# Patient Record
Sex: Male | Born: 1995 | Hispanic: Yes | Marital: Single | Smoking: Never smoker
Health system: Southern US, Community
[De-identification: ages and names within clinical notes are randomized; demographics above are authoritative.]

---

## 2020-01-12 DIAGNOSIS — I639 Cerebral infarction, unspecified: Secondary | ICD-10-CM

## 2020-01-12 HISTORY — DX: Cerebral infarction, unspecified: I63.9

## 2020-05-29 ENCOUNTER — Inpatient Hospital Stay (HOSPITAL_COMMUNITY): Payer: Self-pay

## 2020-05-29 ENCOUNTER — Emergency Department (HOSPITAL_COMMUNITY): Payer: Self-pay

## 2020-05-29 ENCOUNTER — Encounter (HOSPITAL_COMMUNITY): Payer: Self-pay

## 2020-05-29 ENCOUNTER — Inpatient Hospital Stay (HOSPITAL_COMMUNITY)
Admission: EM | Admit: 2020-05-29 | Discharge: 2020-05-31 | DRG: 917 | Discharge status: Against Medical Advice | Payer: Self-pay | Attending: Internal Medicine | Admitting: Internal Medicine

## 2020-05-29 DIAGNOSIS — T50901A Poisoning by unspecified drugs, medicaments and biological substances, accidental (unintentional), initial encounter: Secondary | ICD-10-CM | POA: Diagnosis present

## 2020-05-29 DIAGNOSIS — F13939 Sedative, hypnotic or anxiolytic use, unspecified with withdrawal, unspecified: Secondary | ICD-10-CM | POA: Diagnosis not present

## 2020-05-29 DIAGNOSIS — I10 Essential (primary) hypertension: Secondary | ICD-10-CM | POA: Diagnosis present

## 2020-05-29 DIAGNOSIS — R188 Other ascites: Secondary | ICD-10-CM | POA: Diagnosis present

## 2020-05-29 DIAGNOSIS — E7220 Disorder of urea cycle metabolism, unspecified: Secondary | ICD-10-CM | POA: Diagnosis present

## 2020-05-29 DIAGNOSIS — G9341 Metabolic encephalopathy: Secondary | ICD-10-CM

## 2020-05-29 DIAGNOSIS — R748 Abnormal levels of other serum enzymes: Secondary | ICD-10-CM

## 2020-05-29 DIAGNOSIS — M6282 Rhabdomyolysis: Secondary | ICD-10-CM | POA: Diagnosis present

## 2020-05-29 DIAGNOSIS — R945 Abnormal results of liver function studies: Secondary | ICD-10-CM

## 2020-05-29 DIAGNOSIS — R29706 NIHSS score 6: Secondary | ICD-10-CM | POA: Diagnosis present

## 2020-05-29 DIAGNOSIS — Z20822 Contact with and (suspected) exposure to covid-19: Secondary | ICD-10-CM | POA: Diagnosis present

## 2020-05-29 DIAGNOSIS — E876 Hypokalemia: Secondary | ICD-10-CM | POA: Diagnosis present

## 2020-05-29 DIAGNOSIS — K760 Fatty (change of) liver, not elsewhere classified: Secondary | ICD-10-CM | POA: Diagnosis present

## 2020-05-29 DIAGNOSIS — R0902 Hypoxemia: Secondary | ICD-10-CM | POA: Diagnosis present

## 2020-05-29 DIAGNOSIS — R778 Other specified abnormalities of plasma proteins: Secondary | ICD-10-CM

## 2020-05-29 DIAGNOSIS — R471 Dysarthria and anarthria: Secondary | ICD-10-CM | POA: Diagnosis present

## 2020-05-29 DIAGNOSIS — Z03823 Encounter for observation for suspected inserted (injected) foreign body ruled out: Secondary | ICD-10-CM

## 2020-05-29 DIAGNOSIS — R29810 Facial weakness: Secondary | ICD-10-CM | POA: Diagnosis present

## 2020-05-29 DIAGNOSIS — Z03822 Encounter for observation for suspected aspirated (inhaled) foreign body ruled out: Secondary | ICD-10-CM

## 2020-05-29 DIAGNOSIS — E785 Hyperlipidemia, unspecified: Secondary | ICD-10-CM | POA: Diagnosis present

## 2020-05-29 DIAGNOSIS — N179 Acute kidney failure, unspecified: Secondary | ICD-10-CM | POA: Diagnosis present

## 2020-05-29 DIAGNOSIS — I214 Non-ST elevation (NSTEMI) myocardial infarction: Secondary | ICD-10-CM

## 2020-05-29 DIAGNOSIS — I639 Cerebral infarction, unspecified: Secondary | ICD-10-CM

## 2020-05-29 DIAGNOSIS — J690 Pneumonitis due to inhalation of food and vomit: Secondary | ICD-10-CM | POA: Diagnosis present

## 2020-05-29 DIAGNOSIS — T50904A Poisoning by unspecified drugs, medicaments and biological substances, undetermined, initial encounter: Secondary | ICD-10-CM

## 2020-05-29 DIAGNOSIS — I248 Other forms of acute ischemic heart disease: Secondary | ICD-10-CM | POA: Diagnosis present

## 2020-05-29 DIAGNOSIS — R4182 Altered mental status, unspecified: Secondary | ICD-10-CM

## 2020-05-29 DIAGNOSIS — R7303 Prediabetes: Secondary | ICD-10-CM | POA: Diagnosis present

## 2020-05-29 DIAGNOSIS — R7989 Other specified abnormal findings of blood chemistry: Secondary | ICD-10-CM

## 2020-05-29 DIAGNOSIS — R278 Other lack of coordination: Secondary | ICD-10-CM | POA: Diagnosis present

## 2020-05-29 DIAGNOSIS — R930 Abnormal findings on diagnostic imaging of skull and head, not elsewhere classified: Secondary | ICD-10-CM

## 2020-05-29 DIAGNOSIS — T40411A Poisoning by fentanyl or fentanyl analogs, accidental (unintentional), initial encounter: Principal | ICD-10-CM | POA: Diagnosis present

## 2020-05-29 DIAGNOSIS — I6340 Cerebral infarction due to embolism of unspecified cerebral artery: Secondary | ICD-10-CM | POA: Diagnosis present

## 2020-05-29 LAB — CBC WITH DIFFERENTIAL/PLATELET
Abs Immature Granulocytes: 0.1 10*3/uL — ABNORMAL HIGH (ref 0.00–0.07)
Basophils Absolute: 0 10*3/uL (ref 0.0–0.1)
Basophils Relative: 0 %
Eosinophils Absolute: 0 10*3/uL (ref 0.0–0.5)
Eosinophils Relative: 0 %
HCT: 47.6 % (ref 39.0–52.0)
Hemoglobin: 15.5 g/dL (ref 13.0–17.0)
Immature Granulocytes: 1 %
Lymphocytes Relative: 4 %
Lymphs Abs: 0.6 10*3/uL — ABNORMAL LOW (ref 0.7–4.0)
MCH: 29.6 pg (ref 26.0–34.0)
MCHC: 32.6 g/dL (ref 30.0–36.0)
MCV: 90.8 fL (ref 80.0–100.0)
Monocytes Absolute: 1.5 10*3/uL — ABNORMAL HIGH (ref 0.1–1.0)
Monocytes Relative: 11 %
Neutro Abs: 11.4 10*3/uL — ABNORMAL HIGH (ref 1.7–7.7)
Neutrophils Relative %: 84 %
Platelets: 206 10*3/uL (ref 150–400)
RBC: 5.24 MIL/uL (ref 4.22–5.81)
RDW: 13 % (ref 11.5–15.5)
WBC: 13.7 10*3/uL — ABNORMAL HIGH (ref 4.0–10.5)
nRBC: 0 % (ref 0.0–0.2)

## 2020-05-29 LAB — TROPONIN I (HIGH SENSITIVITY)
Troponin I (High Sensitivity): 1323 ng/L (ref <18)
Troponin I (High Sensitivity): 1566 ng/L (ref <18)
Troponin I (High Sensitivity): 1487 ng/L (ref <18)
Troponin I (High Sensitivity): 1317 ng/L (ref <18)

## 2020-05-29 LAB — HEPATIC FUNCTION PANEL
ALT: 372 U/L — ABNORMAL HIGH (ref 0–44)
AST: 387 U/L — ABNORMAL HIGH (ref 15–41)
Albumin: 4.6 g/dL (ref 3.5–5.0)
Alkaline Phosphatase: 84 U/L (ref 38–126)
Bilirubin, Direct: 0.1 mg/dL (ref 0.0–0.2)
Indirect Bilirubin: 0.7 mg/dL (ref 0.3–0.9)
Total Bilirubin: 0.8 mg/dL (ref 0.3–1.2)
Total Protein: 8.3 g/dL — ABNORMAL HIGH (ref 6.5–8.1)

## 2020-05-29 LAB — URINALYSIS, ROUTINE W REFLEX MICROSCOPIC
Bacteria, UA: NONE SEEN
Bilirubin Urine: NEGATIVE
Glucose, UA: >=500 mg/dL — AB
Ketones, ur: NEGATIVE mg/dL
Leukocytes,Ua: NEGATIVE
Nitrite: NEGATIVE
Protein, ur: 30 mg/dL — AB
Specific Gravity, Urine: 1.016 (ref 1.005–1.030)
pH: 6 (ref 5.0–8.0)

## 2020-05-29 LAB — HEMOGLOBIN A1C
Hgb A1c MFr Bld: 6 % — ABNORMAL HIGH (ref 4.8–5.6)
Mean Plasma Glucose: 125.5 mg/dL

## 2020-05-29 LAB — RAPID URINE DRUG SCREEN, HOSP PERFORMED
Amphetamines: POSITIVE — AB
Barbiturates: NOT DETECTED
Benzodiazepines: POSITIVE — AB
Cocaine: NOT DETECTED
Opiates: NOT DETECTED
Tetrahydrocannabinol: POSITIVE — AB

## 2020-05-29 LAB — LIPID PANEL
Cholesterol: 132 mg/dL (ref 0–200)
HDL: 45 mg/dL (ref >40)
LDL Cholesterol: 76 mg/dL (ref 0–99)
Total CHOL/HDL Ratio: 2.9 RATIO
Triglycerides: 54 mg/dL (ref <150)
VLDL: 11 mg/dL (ref 0–40)

## 2020-05-29 LAB — CK
Total CK: 1410 U/L — ABNORMAL HIGH (ref 49–397)
Total CK: 2772 U/L — ABNORMAL HIGH (ref 49–397)

## 2020-05-29 LAB — ETHANOL: Alcohol, Ethyl (B): <10 mg/dL (ref <10)

## 2020-05-29 LAB — HIV ANTIBODY (ROUTINE TESTING W REFLEX): HIV Screen 4th Generation wRfx: NONREACTIVE

## 2020-05-29 LAB — BASIC METABOLIC PANEL
Anion gap: 11 (ref 5–15)
BUN: 25 mg/dL — ABNORMAL HIGH (ref 6–20)
CO2: 22 mmol/L (ref 22–32)
Calcium: 9 mg/dL (ref 8.9–10.3)
Chloride: 110 mmol/L (ref 98–111)
Creatinine, Ser: 1.66 mg/dL — ABNORMAL HIGH (ref 0.61–1.24)
GFR, Estimated: 59 mL/min — ABNORMAL LOW (ref >60)
Glucose, Bld: 118 mg/dL — ABNORMAL HIGH (ref 70–99)
Potassium: 3.9 mmol/L (ref 3.5–5.1)
Sodium: 143 mmol/L (ref 135–145)

## 2020-05-29 LAB — CBG MONITORING, ED: Glucose-Capillary: 113 mg/dL — ABNORMAL HIGH (ref 70–99)

## 2020-05-29 LAB — AMMONIA
Ammonia: 30 umol/L (ref 9–35)
Ammonia: 47 umol/L — ABNORMAL HIGH (ref 9–35)

## 2020-05-29 LAB — RESP PANEL BY RT-PCR (FLU A&B, COVID) ARPGX2
Influenza A by PCR: NEGATIVE
Influenza B by PCR: NEGATIVE
SARS Coronavirus 2 by RT PCR: NEGATIVE

## 2020-05-29 LAB — VITAMIN B12: Vitamin B-12: 644 pg/mL (ref 180–914)

## 2020-05-29 IMAGING — MR MR HEAD W/O CM
10 series · 48 of 48 positions shown · non-contrast
Comparison: Same day CT head.

EXAM:
MRI HEAD WITHOUT CONTRAST
TECHNIQUE: Multiplanar, multiecho pulse sequences of the brain and surrounding
structures were obtained without intravenous contrast.

[Series 5: DWI · axial · 3.0mm · 1.36mm/px · z∈[-7,+127]mm · 9 of 96 slices shown (1 of 2)]
[im 1/96]
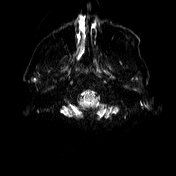
[im 12/96]
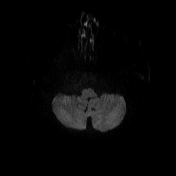
[im 24/96]
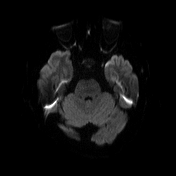
[im 36/96]
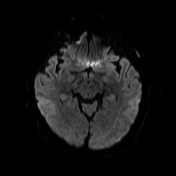
[im 48/96]
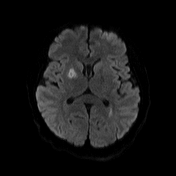
[im 60/96]
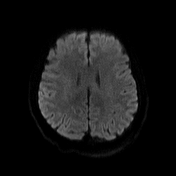
[im 72/96]
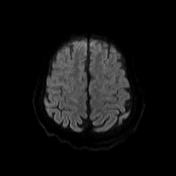
[im 84/96]
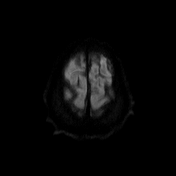
[im 96/96]
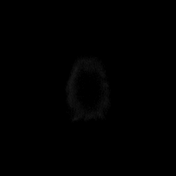

[Series 6: DWI · axial · 3.0mm · 1.36mm/px · z∈[-7,+127]mm · 4 of 48 slices shown (2 of 2)]
[im 1/48]
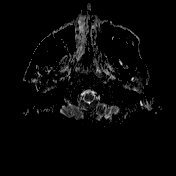
[im 16/48]
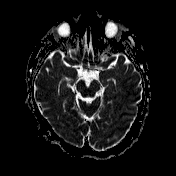
[im 32/48]
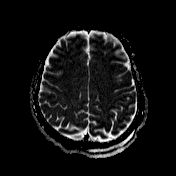
[im 48/48]
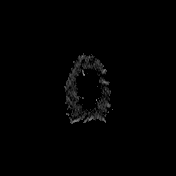

[Series 7: T1 · sagittal · 5.0mm · 0.75mm/px · 2 of 24 slices shown (1 of 2)]
[im 1/24]
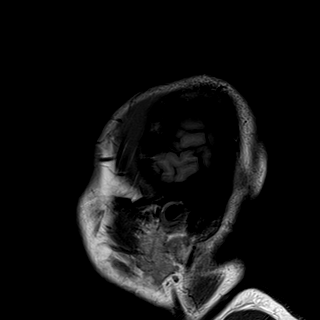
[im 24/24]
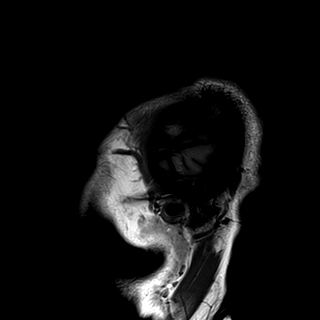

[Series 8: T2 · axial · 5.0mm · 0.62mm/px · z∈[-14,+141]mm · 2 of 26 slices shown (1 of 2)]
[im 1/26]
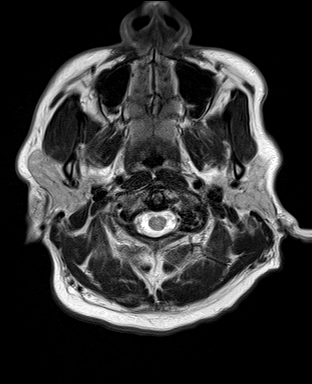
[im 26/26]
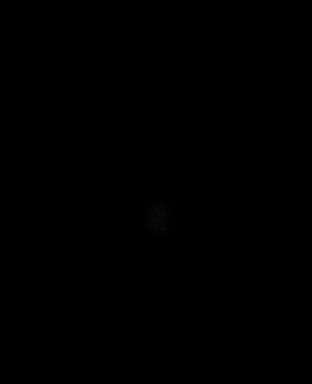

[Series 10: swi_images · axial · 3.0mm · 0.75mm/px · z∈[-39,+165]mm · 6 of 72 slices shown]
[im 1/72]
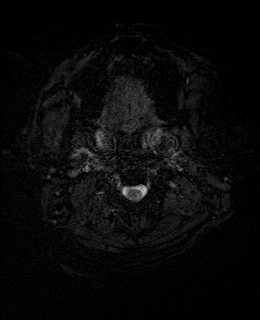
[im 15/72]
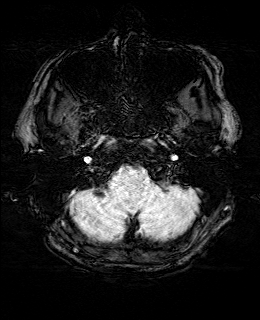
[im 29/72]
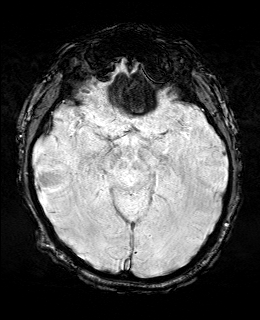
[im 43/72]
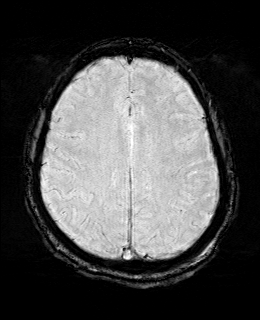
[im 57/72]
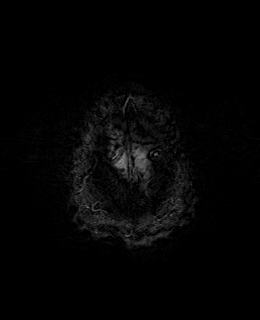
[im 72/72]
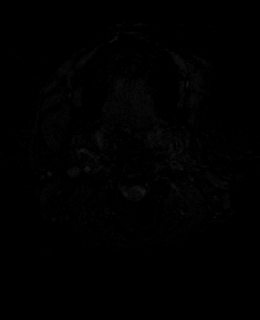

[Series 11: FLAIR · axial · 3.0mm · 0.75mm/px · z∈[-10,+136]mm · 4 of 52 slices shown]
[im 1/52]
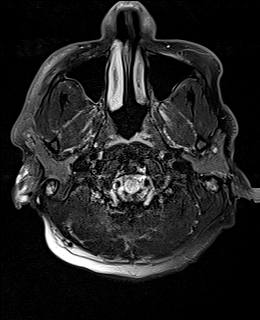
[im 18/52]
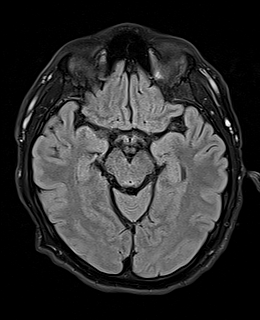
[im 35/52]
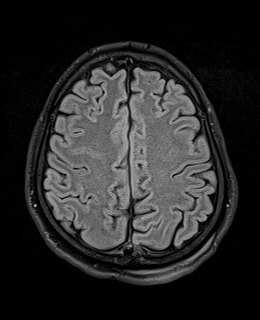
[im 52/52]
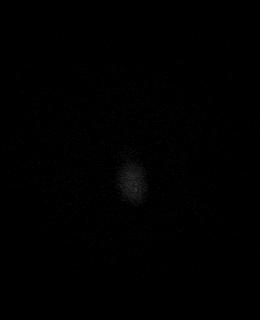

[Series 12: T1 · axial · 1.0mm · 0.94mm/px · z∈[-13,+139]mm · 13 of 160 slices shown (2 of 2)]
[im 1/160]
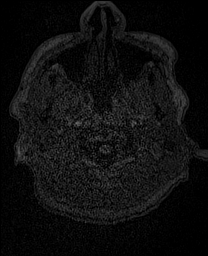
[im 14/160]
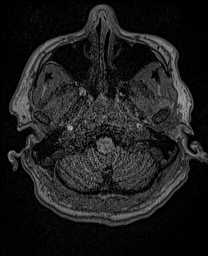
[im 27/160]
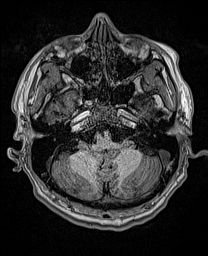
[im 40/160]
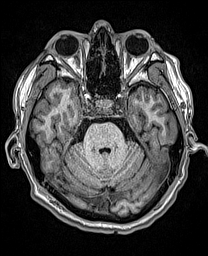
[im 54/160]
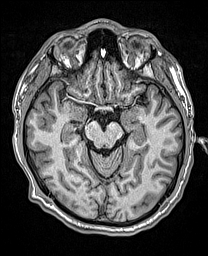
[im 67/160]
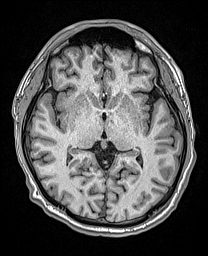
[im 80/160]
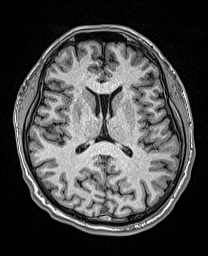
[im 93/160]
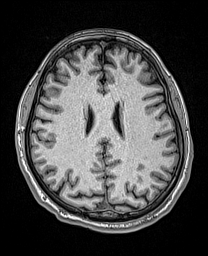
[im 107/160]
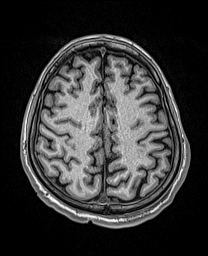
[im 120/160]
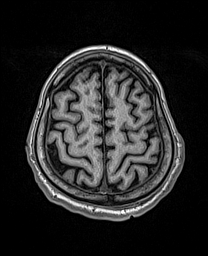
[im 133/160]
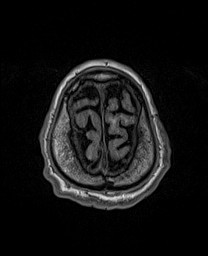
[im 146/160]
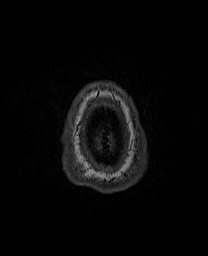
[im 160/160]
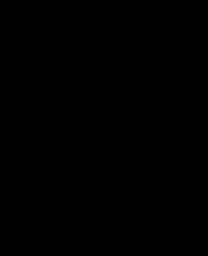

[Series 13: cor dwi_tracew · coronal · 5.0mm · 1.53mm/px · 4 of 50 slices shown]
[im 1/50]
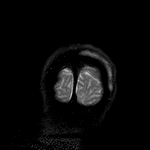
[im 17/50]
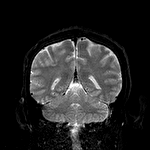
[im 33/50]
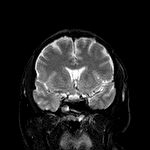
[im 50/50]
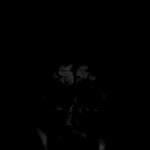

[Series 14: cor dwi_adc · coronal · 5.0mm · 1.53mm/px · 2 of 25 slices shown]
[im 1/25]
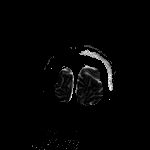
[im 25/25]
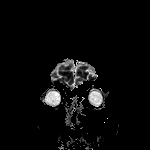

[Series 15: T2 · coronal · 5.0mm · 0.86mm/px · 2 of 28 slices shown (2 of 2)]
[im 1/28]
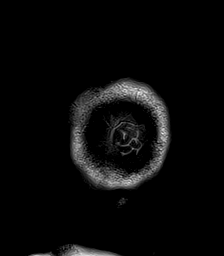
[im 28/28]
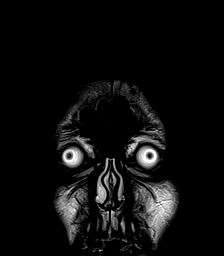

[48 of 48 positions shown; findings below may reference images not displayed]

FINDINGS: Brain: Acute 1.4 cm perforator infarct in the right putamen/globus
pallidus (series 5, image 71) with additional punctate acute infarct
in the adjacent anterior limb of the right internal capsule (series
5, image 75). Acute 0.6 cm perforator infarct in the left globus
pallidus. Amorphous acute infarct in the left posterior parietal
white matter (series 5, image 73). Associated edema without
significant mass effect. No acute hemorrhage. No hydrocephalus.
Cavum septum pellucidum et vergae, anatomic variant. No mass lesion.
No midline shift. Basal cisterns are patent. No extra-axial fluid
collection.

Vascular: Major arterial flow voids are maintained at the skull
base.

Skull and upper cervical spine: Diffuse T1 hypointensity of the
visualized cervical bone marrow. No focal marrow replacing lesion.

Sinuses/Orbits: Mild mucosal thickening of scattered ethmoid air
cells, bilateral sphenoid sinuses and maxillary sinuses with
bilateral sphenoid sinus air-fluid levels and frothy secretions.

Other: No mastoid effusions.
IMPRESSION: 1. Acute perforator infarcts in bilateral basal ganglia and acute
left parietal white matter infarct, as detailed above. Associated
edema without significant mass effect. Given involvement of multiple
vascular territories, consider embolic etiology.
2. Mild paranasal sinus mucosal thickening with bilateral sphenoid
sinus air-fluid levels.
3. Diffuse T1 hypointensity of the visualized cervical bone marrow,
which is nonspecific but could be related to chronic anemia, chronic
hypoxia (such as in smokers), or less likely a lymphoproliferative
disorder.

## 2020-05-29 IMAGING — CR DG ABDOMEN 1V
1 series · 1 of 1 positions shown · non-contrast
Comparison: None.

CLINICAL DATA: Pre MRI. Patient unresponsive. Evaluate for foreign
body that may preclude MRI.

EXAM:
ABDOMEN - 1 VIEW

[x abdomen supine]
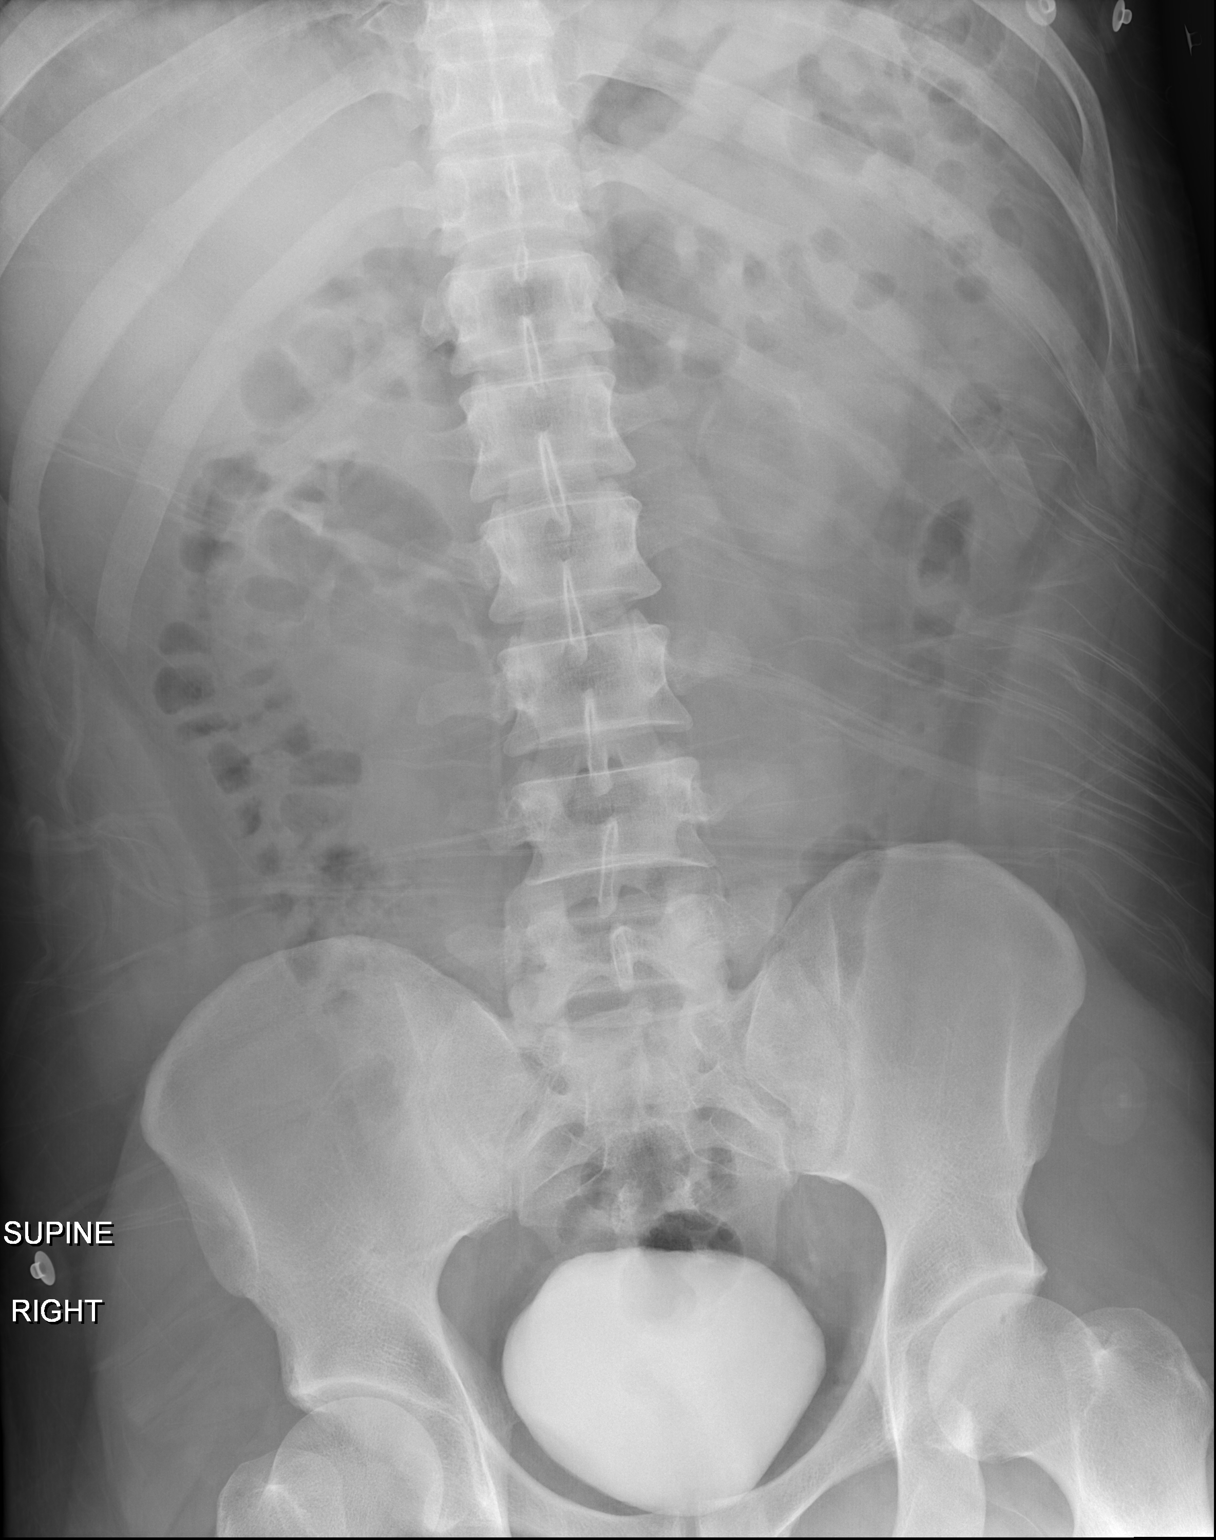

[1 of 1 positions shown; findings below may reference images not displayed]

FINDINGS: Contrast noted in the renal collecting systems and bladder from a
recent CT scan.

The abdominal bowel gas pattern is unremarkable.

No radiopaque foreign bodies are identified.
IMPRESSION: No radiopaque foreign bodies are identified.

## 2020-05-29 IMAGING — CT CT ANGIO CHEST
2 of 6 series · 18 of 36 positions shown · IV contrast (omnipaque)
Comparison: None.

CLINICAL DATA: Unresponsive, hypoxic, high probability PE

EXAM:
CT ANGIOGRAPHY CHEST WITH CONTRAST
TECHNIQUE: Multidetector CT imaging of the chest was performed using the
standard protocol during bolus administration of intravenous
contrast. Multiplanar CT image reconstructions and MIPs were
obtained to evaluate the vascular anatomy.
CONTRAST:  100mL OMNIPAQUE IOHEXOL 350 MG/ML SOLN

[Series 5: thins · axial · 0.69mm/px · z∈[-457,-251]mm · 17 of 232 slices shown]
[im 13/232  lung]
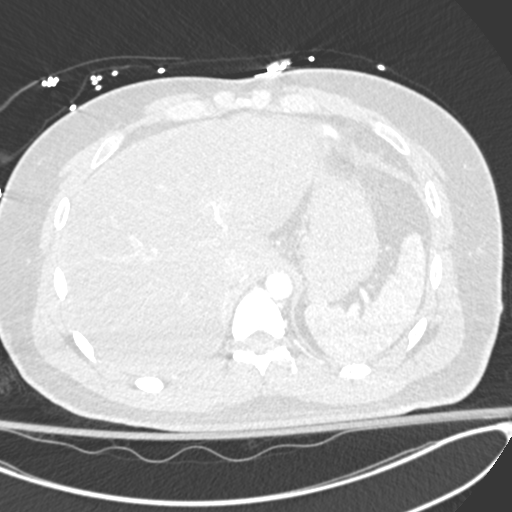
[im 26/232  mediastinal]
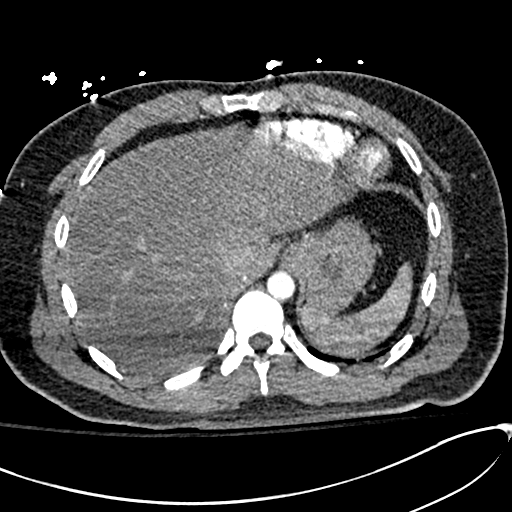
[im 39/232  lung]
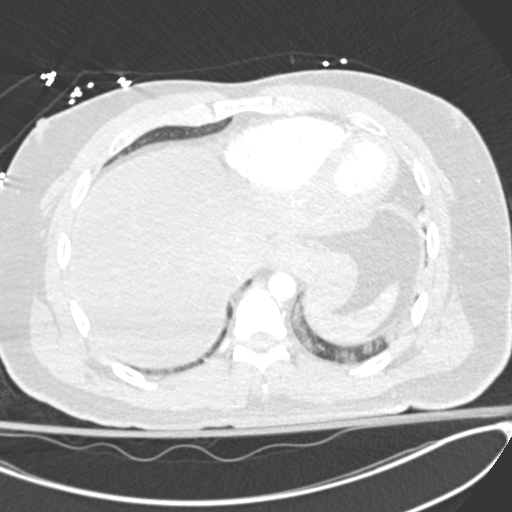
[im 52/232  mediastinal]
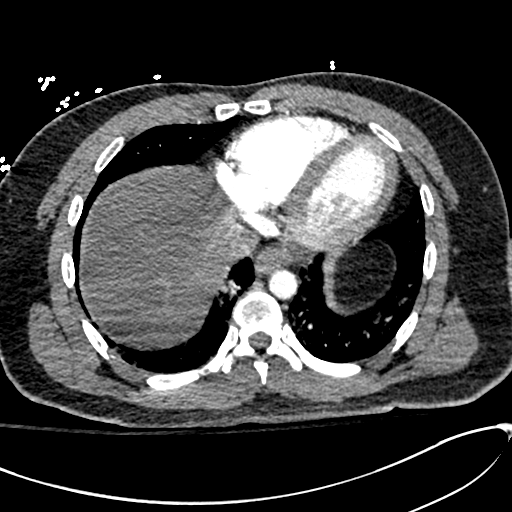
[im 65/232  lung]
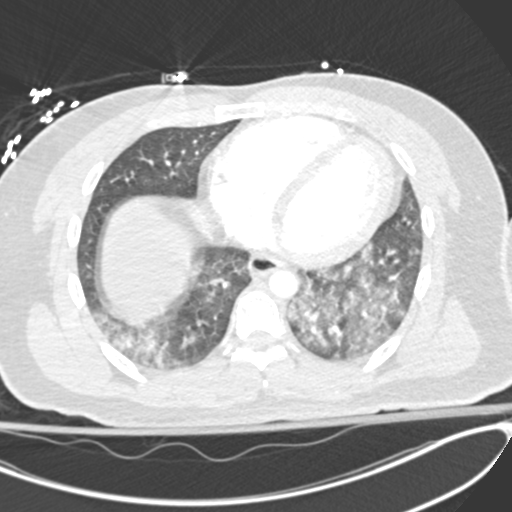
[im 78/232  mediastinal]
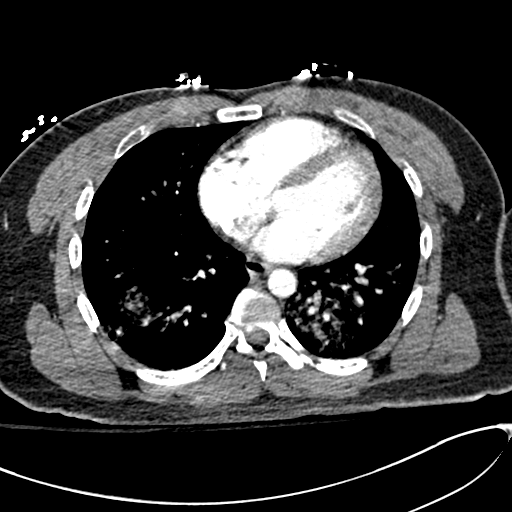
[im 90/232  lung]
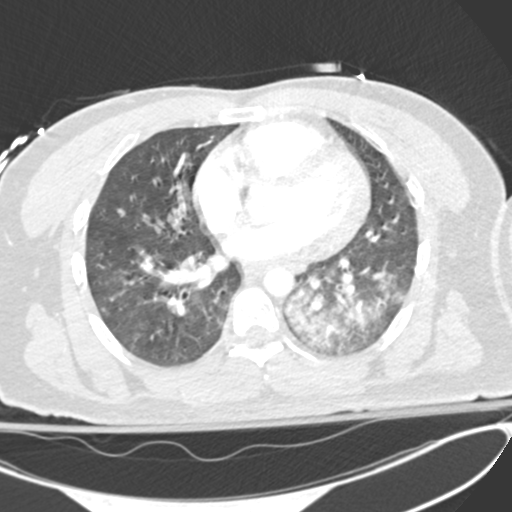
[im 103/232  mediastinal]
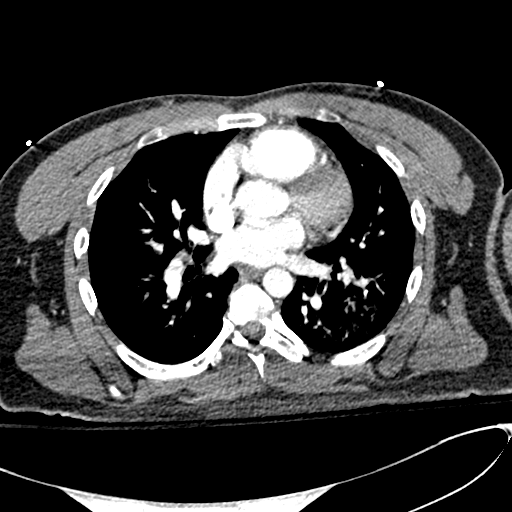
[im 116/232  lung]
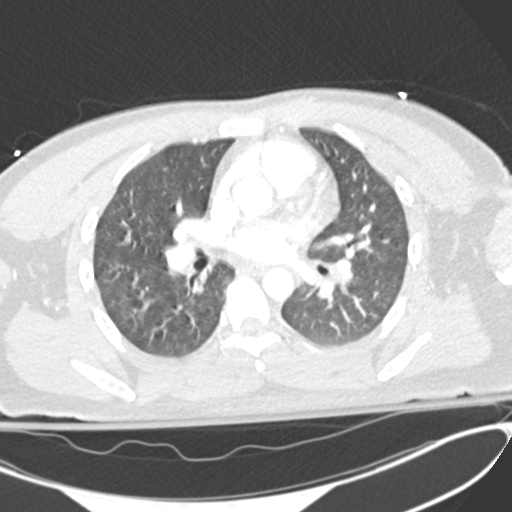
[im 129/232  mediastinal]
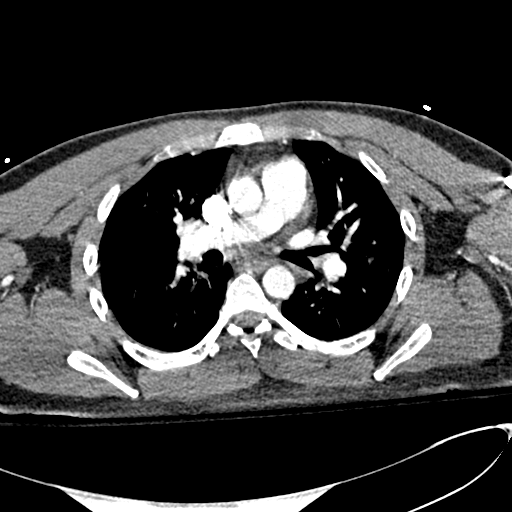
[im 142/232  lung]
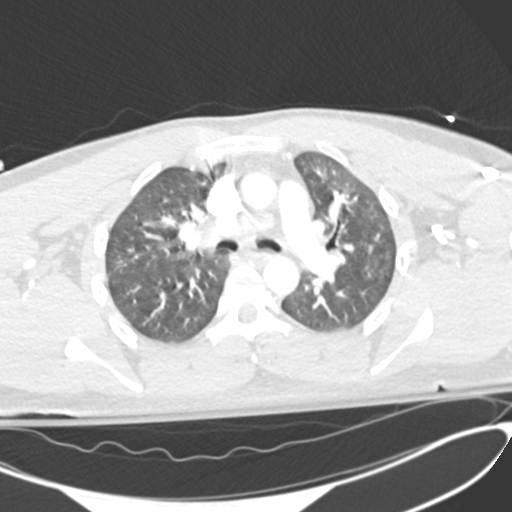
[im 155/232  mediastinal]
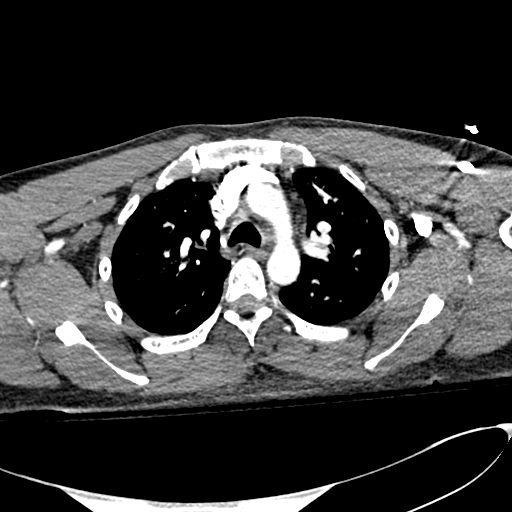
[im 167/232  lung]
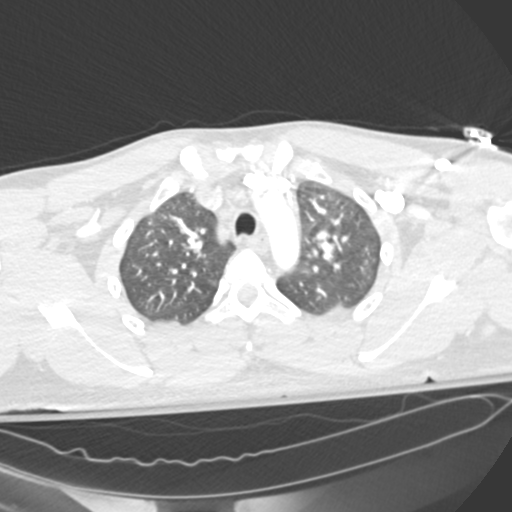
[im 180/232  mediastinal]
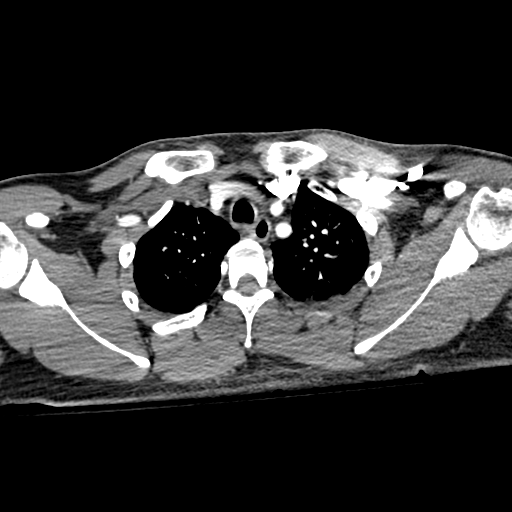
[im 193/232  lung]
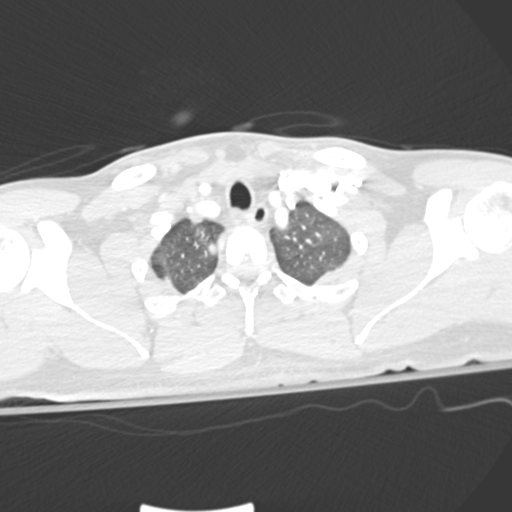
[im 206/232  mediastinal]
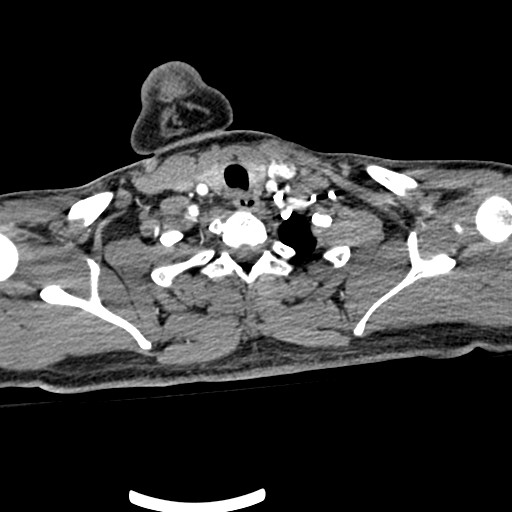
[im 219/232  lung]
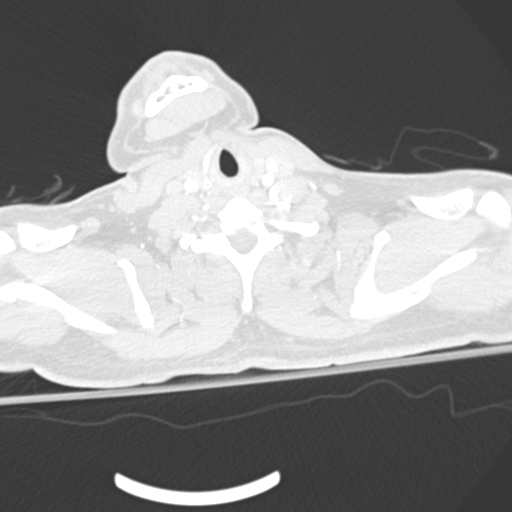

[Series 7: coronal mpr · coronal · 0.49mm/px · 1 of 118 slices shown]
[im 59/118  mediastinal]
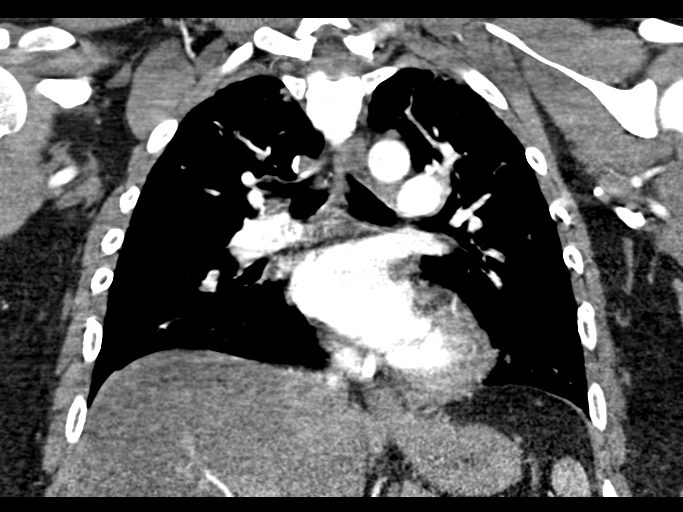

[18 of 36 positions shown; findings below may reference images not displayed]

FINDINGS: Cardiovascular: Heart size normal. No pericardial effusion.
Satisfactory opacification of pulmonary arteries noted, and there is
no evidence of pulmonary emboli. Motion degrades some of the images
through the lung bases. Adequate contrast opacification of the
thoracic aorta with no evidence of dissection, aneurysm, or
stenosis. There is bovine variant brachiocephalic arch anatomy
without proximal stenosis. No significant atheromatous change.

Mediastinum/Nodes: No mass or adenopathy.

Lungs/Pleura: No pleural effusion. No pneumothorax. Patchy
ground-glass and airspace opacities in both lower lobes left greater
than right. Scattered ground-glass alveolar opacities in the
posterior left upper lobe.

Upper Abdomen: No acute abnormality.

Musculoskeletal: No chest wall abnormality. No acute or significant
osseous findings.

Review of the MIP images confirms the above findings.
IMPRESSION: 1. Negative for acute PE or thoracic aortic dissection.
2. Patchy bibasilar and posterior left upper lobe infiltrates,
nonspecific but possibly related to aspiration.

## 2020-05-29 MED ORDER — SODIUM CHLORIDE 0.9 % IV SOLN
3.0000 g | Freq: Once | INTRAVENOUS | Status: AC
  Administered 2020-05-29: 3 g via INTRAVENOUS
  Filled 2020-05-29: qty 8

## 2020-05-29 MED ORDER — NALOXONE HCL 0.4 MG/ML IJ SOLN
0.2000 mg | Freq: Once | INTRAMUSCULAR | Status: DC
  Filled 2020-05-29: qty 1

## 2020-05-29 MED ORDER — IOHEXOL 350 MG/ML SOLN
100.0000 mL | Freq: Once | INTRAVENOUS | Status: AC | PRN
  Administered 2020-05-29: 100 mL via INTRAVENOUS

## 2020-05-29 MED ORDER — SODIUM CHLORIDE 0.9 % IV SOLN: INTRAVENOUS | Status: DC

## 2020-05-29 MED ORDER — SODIUM CHLORIDE 0.9 % IV SOLN
2.0000 g | INTRAVENOUS | Status: DC
  Administered 2020-05-29 – 2020-05-30 (×2): 2 g via INTRAVENOUS
  Filled 2020-05-29 (×2): qty 20

## 2020-05-29 MED ORDER — SODIUM CHLORIDE 0.9 % IV SOLN
1.0000 mg | Freq: Once | INTRAVENOUS | Status: AC
  Administered 2020-05-29: 1 mg via INTRAVENOUS
  Filled 2020-05-29: qty 0.2

## 2020-05-29 MED ORDER — LACTATED RINGERS IV SOLN: INTRAVENOUS | Status: DC

## 2020-05-29 MED ORDER — ENOXAPARIN SODIUM 40 MG/0.4ML IJ SOSY
40.0000 mg | PREFILLED_SYRINGE | INTRAMUSCULAR | Status: DC
  Administered 2020-05-29 – 2020-05-30 (×2): 40 mg via SUBCUTANEOUS
  Filled 2020-05-29 (×2): qty 0.4

## 2020-05-29 MED ORDER — METOPROLOL TARTRATE 5 MG/5ML IV SOLN: 5.0000 mg | Freq: Four times a day (QID) | INTRAVENOUS | Status: DC | PRN

## 2020-05-29 MED ORDER — ORAL CARE MOUTH RINSE
15.0000 mL | Freq: Two times a day (BID) | OROMUCOSAL | Status: DC
  Administered 2020-05-30: 15 mL via OROMUCOSAL

## 2020-05-29 MED ORDER — THIAMINE HCL 100 MG/ML IJ SOLN: 100.0000 mg | Freq: Every day | INTRAMUSCULAR | Status: DC

## 2020-05-29 MED ORDER — THIAMINE HCL 100 MG/ML IJ SOLN
500.0000 mg | Freq: Three times a day (TID) | INTRAVENOUS | Status: DC
  Administered 2020-05-29 – 2020-05-31 (×5): 500 mg via INTRAVENOUS
  Filled 2020-05-29 (×7): qty 5

## 2020-05-29 MED ORDER — STROKE: EARLY STAGES OF RECOVERY BOOK
Freq: Once | Status: DC
  Filled 2020-05-29: qty 1

## 2020-05-29 MED ORDER — SODIUM CHLORIDE 0.9 % IV SOLN
500.0000 mg | INTRAVENOUS | Status: DC
  Administered 2020-05-29 – 2020-05-30 (×2): 500 mg via INTRAVENOUS
  Filled 2020-05-29 (×3): qty 500

## 2020-05-29 MED ORDER — SODIUM CHLORIDE 0.9 % IV BOLUS
500.0000 mL | Freq: Once | INTRAVENOUS | Status: AC
  Administered 2020-05-29: 500 mL via INTRAVENOUS

## 2020-05-29 MED ORDER — LORAZEPAM 2 MG/ML IJ SOLN
1.0000 mg | Freq: Once | INTRAMUSCULAR | Status: AC | PRN
  Administered 2020-05-29: 1 mg via INTRAVENOUS

## 2020-05-29 MED ORDER — LORAZEPAM 2 MG/ML IJ SOLN
1.0000 mg | Freq: Once | INTRAMUSCULAR | Status: DC
  Filled 2020-05-29: qty 1

## 2020-05-29 NOTE — Care Plan (Signed)
Per radiology guildlines the following will need to be obtained for this pt to be scanned. Reaching out to ordering MD to make aware.   [email or web address]

## 2020-05-29 NOTE — Consult Note (Addendum)
Neurology Consultation Reason for Consult: Multifocal strokes on MRI Requesting Physician: Whitney Post  CC: Found down  History is obtained from: Patient and chart review  HPI: Garrett West is a 25 y.o. male with past medical history significant for polysubstance use (amphetamines, benzodiazepines, THC positive this admission and additionally endorsing fentanyl use with response to Narcan in the field)  He was initially poorly responsive but mental status has been improving over the course of his hospitalization.  He has been started on Unasyn, azithromycin and ceftriaxone for aspiration versus community-acquired pneumonia.  He is extremely sleepy on my examination but after repeated noxious stimulation does awaken enough to tell me the back of his head hurts though he denies pain anywhere else.  He is extremely concerned about contacting his wife to let her know that he is okay but does not remember her phone number and reports he could only contact her through Facebook if he were able to login somewhere.  He reports not recalling what happened, denies fentanyl use stating he does not know what that is, reports he only uses marijuana and he drinks 1 beer a day.  He is extremely thirsty  LKW: Unknown tPA given?: No, due to out of the window Premorbid modified rankin scale: (presumed)     0 - No symptoms.  ROS: Unable to obtain due to altered mental status  History reviewed. No pertinent past medical history. Unable to assess secondary to patient's mental status   History reviewed. No pertinent family history. Unable to assess secondary to patient's mental status   Social History:  has no history on file for tobacco use, alcohol use, and drug use.  Has reported fentanyl use and U tox additionally positive for amphetamines, benzodiazepines and THC was (obtained prior to patient getting lorazepam in the ED).   Exam: Current vital signs: BP 118/75 (BP Location: Left Arm)    Pulse (!) 105   Temp 98.6 F (37 C) (Oral)   Resp 15   Wt 79.2 kg   SpO2 97%  Vital signs in last 24 hours: Temp:  [98.3 F (36.8 C)-99.6 F (37.6 C)] 98.3 F (36.8 C) (05/19 1839) Pulse Rate:  [105-134] 105 (05/19 1839) Resp:  [14-24] 15 (05/19 1839) BP: (102-134)/(63-90) 128/86 (05/19 1839) SpO2:  [91 %-100 %] 95 % (05/19 1839) Weight:  [79.2 kg] 79.2 kg (05/19 1839)   Physical Exam  Constitutional: Appears well-developed and well-nourished, muscular.  Psych: Affect appropriate to situation, anxious but cooperative Eyes: No scleral injection HENT: No oropharyngeal obstruction.  MSK: no joint deformities.  Cardiovascular: Normal rate and regular rhythm.  Respiratory: Effort normal, non-labored breathing GI: Soft.  No distension. There is no tenderness.  Skin: Warm to the touch but afebrile dry and intact visible skin, scattered tattoos  Neuro: Mental Status: Patient is very hard to awaken but after repeated noxious stimulation maintains alertness for some time.  He is oriented to person, place, not month (reports it's June), year, and does not know the details of what happened No signs of aphasia or neglect Cranial Nerves: II: Visual Fields are full. Pupils are equal, round, and reactive to light.  2.5 to 1 mm III,IV, VI: EOMI  V: Facial sensation is symmetric to eyelash brush VII: Facial movement is symmetric.  VIII: hearing is intact to voice XII: tongue is midline without atrophy or fasciculations.  Motor: Tone is normal. Bulk is normal.  He is noticeably weaker in the left upper extremity and slightly weaker in the  left lower extremity, he frequently uses the right upper extremity to assist movement of the left upper extremity when asked to perform commands such as finger-to-nose testing.  He has some mild asterixis Sensory: Sensation is symmetric to light touch in the arms and legs. Deep Tendon Reflexes: 2+ and symmetric in the biceps, could not elicit  patellae. Plantars: Toes are mute bilaterally. Cerebellar: His coordination is fairly compromised bilaterally in the upper extremities, left worse than right in the upper extremities, toe to hand is intact bilaterally  NIHSS total 6 Score breakdown: One-point for drowsiness, one-point for slight right facial droop, one-point for left arm drift, 2 points for bilateral upper extremity ataxia, one-point for dysarthria  I have reviewed labs in epic and the results pertinent to this consultation are:  CMP notable for mildly elevated glucose at 118, creatinine 1.66, BUN 25, AST and ALT in the high 300s,   CK 1410 -> 2772  CBC notable for leukocytosis to 13.7  Troponin peak at 1566  COVID-19/influenza panel negative  UA with hemoglobin pigment but otherwise negative for infection.  UDS positive for amphetamines, benzodiazepines, THC  I have reviewed the images obtained:  Left upper quadrant ultrasound notable for diffuse hepatic echogenicity concerning for fatty infiltration versus hepatocellular disease  MRI brain personally reviewed, agree with radiology read: 1. 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 Associated edema without significant mass effect. Given involvement of multiple vascular territories, consider embolic etiology. 2. Mild paranasal sinus mucosal thickening with bilateral sphenoid sinus air-fluid levels. 3. Diffuse T1 hypointensity of the visualized cervical bone marrow, which is nonspecific but could be related to chronic anemia, chronic hypoxia (such as in smokers), or less likely a lymphoproliferative disorder.   Impression: Multifocal strokes in the setting of polysubstance use, likely related to vasospasm secondary to stimulant use (amphetamines), however in the setting of high risk behavior we will also evaluate for potential neurosyphilis as this can be associated with bilateral basal  ganglia strokes in particular.  Pending initial TTE results, TEE may also be useful to rule out endocarditis or intracardiac thrombus although at this time he is minimally elevated white count and has been afebrile.   Given his ataxia and his daily drinking, which he may be under reporting, will additionally start thiamine empirically.  Recommendations: #Multifocal strokes - Stroke labs RPR, HgbA1c, fasting lipid panel  - If RPR is positive, consider LP for neurosyphilis eval - MRA of the brain without contrast and MRA neck w/wo - Carotid dopplers only if MRA neck is too motion limited to be useful - Frequent neuro checks - Echocardiogram - Prophylactic therapy-Antiplatelet med: Aspirin - dose 325mg  PO or 300mg  PR, followed by 81 mg daily - Consider Plavix 300 mg load with 75 mg daily for 21 day course if LP is not needed - Risk factor modification, substance use cessation counseling - Telemetry monitoring - Blood pressure goal   - Normotension given patient is tolerating normotension with improvement in his neurological status suggesting CNS perfusion is NOT dependent on elevated blood pressure - PT consult, OT consult, Speech consult, unless patient is back to baseline - Stroke team to follow  #Encephalopathy in the setting of high risk behaviors - B12, thiamine levels, HIV, ammonia to assess for reversible causes of encephalopathy - empiric thiamine supplementation 500 mg IV q8hr x 3 days, then 100 mg daily  #CK elevation -Trend 5 AM and 5 PM until peak -Normal saline  150 cc/h, increased to 200 cc/h after evening CK level resulted  -Adjust IV fluid rate as needed based on CK trends  Appreciate management of polysubstance toxicity per primary team, including potential for alcohol withdrawal  Brooke Dare MD-PhD [facility] [phone] Available 7 PM to 7 AM, outside of these hours please call Neurologist on call as listed on Amion.

## 2020-05-29 NOTE — Plan of Care (Addendum)
1045 hrs Called by Dr. Renaye Rakers: Regarding abnormal CT finding of a possible right basal ganglia hypodensity concerning for stroke. Not much history is known about the patient-brought in unresponsive, extremely agitated right now Concern for fentanyl overdose with unknown downtime. I suggested that they follow this up with an MRI of the brain to ensure that there is no stroke or changes associated with fentanyl overdose. Needs medical stabilization.  If does not show improvement, might need EEG etc. I would recommend admitting to [facility]. Call neurology if MRI shows stroke or if there are any other neurological concerns for home consultation I will be available-Dr Trifan aware  -- Milon Dikes, MD Neurologist [facility] Pager: [phone]  Addendum 1530 hrs Called by Dr. Oswaldo Done MRI shows multifocal infarctions concerning for embolic events. Urinary toxicology screen is positive for THC and amphetamines.  Reportedly also had fentanyl abuse. I suspect that these strokes are secondary to vasospasm from stimulant-amphetamine. According to primary hospitalist, maintaining his airway. I recommended that be sent to [facility]  for stroke work-up. See him once he arrives at [facility]  -- Milon Dikes, MD Neurologist [facility] Pager: [phone]

## 2020-05-29 NOTE — ED Triage Notes (Signed)
Pt BIB [facility], found unresponsive in car in grocery store parking lot. 2mg  narcan intranasal, became somewhat combative. 20ga RH. SpO2 when found 60s, up to 94%. EMS reports possible aspiration.  BP 110/70, 80 palp initial

## 2020-05-29 NOTE — H&P (Signed)
History and Physical        Hospital Admission Note Date: 05/29/2020  Patient name: Garrett West Medical record number: [id number] Date of birth: 09/21/1995 Age: 25 y.o. Gender: male  PCP: No primary care provider on file.    Chief Complaint    Chief Complaint  Patient presents with  . Drug Overdose      HPI:   History obtained from prior notes as patient is somnolent  This is a 25 year old Spanish-speaking male with no known past medical history presented to the ED via EMS after being found unresponsive in a parking lot this a.m., suspected from a fentanyl overdose.  Unclear how long the patient had been down.  Fire rescue administered intranasal Narcan prior to arrival to the ED and the patient immediately responded. He was also noted to be hypoxic at 70% initially at the scene.  Upon arrival to the ED, the patient was confused, agitated and combative.  A translator in the ED was able to help the ED provider obtained some history and the patient reported he had used fentanyl last night.  He continued to thrash in the bed and was not cooperative during the ED provider work-up which was thought to be due to withdrawal symptoms and was given Ativan.   ED Course: Afebrile, tachycardic, Initially placed on 2 L/min nasal cannula and weaned to room air satting 100%. Notable Labs: Sodium 143, K3.9, glucose 118, BUN 25, creatinine 1.66, AST 387, ALT 372, T bili 0.8, CK14 10, troponin 1323-> 1566, WBC 13.7, EtOH negative, COVID-19 and flu negative. Notable Imaging: CXR-RLL and retrocardiac faint scattered opacity; CTA chest-negative for PE, patchy bibasilar and posterior left upper lobe infiltrates possibly related to aspiration; CT head- low density in the right basal ganglia suggestive of an acute infarct.  ED provider discussed troponin with cardiology who felt this was almost certainly  demand ischemia, recommended an echo and formal cardiology consult if echo was abnormal.  ED provider discussed with Dr. Wilford Corner from neurology regarding the CT findings who recommended a brain MRI and transfer to [facility] as he may need an EEG and further neurologic work-up.  He received: 500 cc NS bolus, Unasyn, and Narcan prior to arrival.   Vitals:   05/29/20 1045 05/29/20 1128  BP: 134/81 119/84  Pulse: (!) 121 (!) 120  Resp: 18 16  Temp:    SpO2: 100% 97%     Review of Systems:  Review of Systems  Unable to perform ROS: Mental status change    Medical/Social/Family History   Past Medical History: History reviewed. No pertinent past medical history.  History reviewed. No pertinent surgical history.  Medications: Prior to Admission medications   Not on File    Allergies:  Not on File  Social History:  has no history on file for tobacco use, alcohol use, and drug use.  Family History: History reviewed. No pertinent family history.   Objective   Physical Exam: Blood pressure 119/84, pulse (!) 120, temperature 98.4 F (36.9 C), temperature source Oral, resp. rate 16, SpO2 97 %.  Physical Exam Vitals reviewed.  Constitutional:      General: He is not in acute distress.    Comments: Asleep. Arouses to  verbal stimuli then falls back to sleep  HENT:     Head: Normocephalic.  Eyes:     Comments: Pinpoint pupils bilaterally     LABS on Admission: I have personally reviewed all the labs and imaging below    Basic Metabolic Panel: Recent Labs  Lab 05/29/20 0815  NA 143  K 3.9  CL 110  CO2 22  GLUCOSE 118*  BUN 25*  CREATININE 1.66*  CALCIUM 9.0   Liver Function Tests: Recent Labs  Lab 05/29/20 0815  AST 387*  ALT 372*  ALKPHOS 84  BILITOT 0.8  PROT 8.3*  ALBUMIN 4.6   No results for input(s): LIPASE, AMYLASE in the last 168 hours. No results for input(s): AMMONIA in the last 168 hours. CBC: Recent Labs  Lab 05/29/20 0815  WBC 13.7*   NEUTROABS 11.4*  HGB 15.5  HCT 47.6  MCV 90.8  PLT 206   Cardiac Enzymes: Recent Labs  Lab 05/29/20 0815  CKTOTAL 1,410*   BNP: Invalid input(s): POCBNP CBG: Recent Labs  Lab 05/29/20 0848  GLUCAP 113*    Radiological Exams on Admission:  CT Head Wo Contrast  Result Date: 05/29/2020 CLINICAL DATA:  Mental status change.  Found unresponsive. EXAM: CT HEAD WITHOUT CONTRAST TECHNIQUE: Contiguous axial images were obtained from the base of the skull through the vertex without intravenous contrast. COMPARISON:  None. FINDINGS: Brain: There is abnormal hypoattenuation involving the right lentiform nucleus and internal capsule. The left basal ganglia appear intact. No acute cortically based infarct, intracranial hemorrhage, mass/mass effect, or extra-axial fluid collection is identified. The ventricles are normal in size. A cavum septum pellucidum is incidentally noted. Vascular: No hyperdense vessel. Skull: No fracture or suspicious osseous lesion. Sinuses/Orbits: Mild mucosal thickening in the included paranasal sinuses. Clear mastoid air cells. Disc conjugate gaze. Other: None IMPRESSION: Low-density in the right basal ganglia suggestive of an acute infarct. Electronically Signed   By: Sebastian Ache M.D.   On: 05/29/2020 10:32   CT Angio Chest PE W and/or Wo Contrast  Result Date: 05/29/2020 CLINICAL DATA:  Unresponsive, hypoxic, high probability PE EXAM: CT ANGIOGRAPHY CHEST WITH CONTRAST TECHNIQUE: Multidetector CT imaging of the chest was performed using the standard protocol during bolus administration of intravenous contrast. Multiplanar CT image reconstructions and MIPs were obtained to evaluate the vascular anatomy. CONTRAST:  OMNIPAQUE IOHEXOL 350 MG/ML SOLN COMPARISON:  None. FINDINGS: Cardiovascular: Heart size normal. No pericardial effusion. Satisfactory opacification of pulmonary arteries noted, and there is no evidence of pulmonary emboli. Motion degrades some of the  images through the lung bases. Adequate contrast opacification of the thoracic aorta with no evidence of dissection, aneurysm, or stenosis. There is bovine variant brachiocephalic arch anatomy without proximal stenosis. No significant atheromatous change. Mediastinum/Nodes: No mass or adenopathy. Lungs/Pleura: No pleural effusion. No pneumothorax. Patchy ground-glass and airspace opacities in both lower lobes left greater than right. Scattered ground-glass alveolar opacities in the posterior left upper lobe. Upper Abdomen: No acute abnormality. Musculoskeletal: No chest wall abnormality. No acute or significant osseous findings. Review of the MIP images confirms the above findings. IMPRESSION: 1. Negative for acute PE or thoracic aortic dissection. 2. Patchy bibasilar and posterior left upper lobe infiltrates, nonspecific but possibly related to aspiration. Electronically Signed   By: Corlis Leak M.D.   On: 05/29/2020 10:17   DG Chest Portable 1 View  Result Date: 05/29/2020 CLINICAL DATA:  evaluate for aspiration PNA, suspected opiod overdose, hypoxic EXAM: PORTABLE CHEST - 1 VIEW COMPARISON:  None available. FINDINGS: The mediastinal contours are within normal limits. No cardiomegaly. Right lower lobe and retrocardiac faint scattered opacities. No significant pleural effusion or evidence of pneumothorax. No acute osseous abnormality. IMPRESSION: Right lower lobe and retrocardiac faint scattered opacities which are nonspecific but could represent sequela of aspiration, multifocal pneumonia, or subsegmental atelectasis. Electronically Signed   By: Marliss Cootsylan  Suttle MD   On: 05/29/2020 08:35      EKG: sinus tachycardia, no pathologic ST changes   A & P   Principal Problem:   Overdose Active Problems:   Abnormal head CT   Elevated troponin   Rhabdomyolysis   Elevated LFTs   Acute metabolic encephalopathy   1. Suspected Fentanyl Overdose a. S/p narcan x1 with initial improvement but seemed to go  through withdrawal in the ED so received ativan and now sleeping, protecting airway b. Advise against recreational drugs once awake c. UDS ordered d. Continue to monitor closely  2. Concern for aspiration event, concern for developing aspiration pneumonia a. CTA chest with patchy bibasilar and posterior LUL infiltrates b. Leukocytosis and tachycardia (could be from infection vs. reactive) c. Empiric Ceftriaxone and azithromycin for now d. Aspiration precautions  3. Abnormal Head CT  a. CT: Low density in the right basal ganglia suggestive of an acute infarct b. Neurology aware and recommends MRI brain to ensure that there is no stroke or changes associated with fentanyl overdose. May need an EEG, etc. [facility] c. Will check Lipid panel and HbA1c in AM  4. Elevated troponin, suspect demand ischemia a. Trop 1323 -> 1566, EKG Sinus tachycardia without pathologic ST changes b. ED provider discussed with cardiology who felt this was almost certainly demand ischemia, recommended an echo and formal cardiology consult if echo was abnormal c. Will repeat troponin   5. Mild Rhabdomyolysis, unclear etiology a. CK 1410 b. IV fluids and follow up in AM  6. Elevated LFTs a. RUQ US  7. Elevated Creatinine a. Unknown baseline, suspect slight AKI b. Follow up after IV fluids  8. Acute metabolic encephalopathy, multifactorial: fentanyl overdose, ativan, suspected basal ganglia infarct and suspected pneumonia a. Check ammonia level b. Plan as above     DVT prophylaxis: lovenox   Code Status: Full Code  Diet: NPO Family Communication: Admission, patients condition and plan of care including tests being ordered have been discussed with the patient who indicates understanding and agrees with the plan and Code Status. No patient contacts in the system Disposition Plan: The appropriate patient status for this patient is INPATIENT. Inpatient status is judged to  be reasonable and necessary in order to provide the required intensity of service to ensure the patient's safety. The patient's presenting symptoms, physical exam findings, and initial radiographic and laboratory data in the context of their chronic comorbidities is felt to place them at high risk for further clinical deterioration. Furthermore, it is not anticipated that the patient will be medically stable for discharge from the hospital within 2 midnights of admission. The following factors support the patient status of inpatient.   " The patient's presenting symptoms include found down. " The worrisome physical exam findings include somnolent. " The initial radiographic and laboratory data are worrisome because of as above. " The chronic co-morbidities are unknown.   * I certify that at the point of admission it is my clinical judgment that the patient will require inpatient hospital care spanning beyond 2 midnights from the point of admission due to high  intensity of service, high risk for further deterioration and high frequency of surveillance required.*     The medical decision making on this patient was of high complexity and the patient is at high risk for clinical deterioration, therefore this is a level 3  admission.  Consultants  . ED provider discussed with Cardiology . ED provider discussed with neurology  Procedures  . none  Time Spent on Admission: 73 minutes    Jae Dire, DO [facility]  05/29/2020, 12:25 PM

## 2020-05-29 NOTE — ED Notes (Signed)
Patient transported to MRI 

## 2020-05-29 NOTE — ED Provider Notes (Signed)
[facility]-EMERGENCY DEPT Provider Note   CSN: [id number] Arrival date & time: 05/29/20  0750     History CC:  Unresponsiveness  Garrett West is a 25 y.o. male presenting to emergency department via EMS with a suspected overdose.  The patient is confused and agitated on arrival, primarily Spanish-speaking.  With a translator at bedside.  Were able to clean a small amount of history.  The patient reports that he was using fentanyl earlier tonight.  He reports he is having pain in his chest now and his back.  EMS reports they were called out to the scene as the patient was found unresponsive in a parking lot.  Was unclear how long the patient been down.  Fire rescue administered intranasal Narcan prior to their arrival, the patient had immediately awoke and responded to it.  On their arrival the patient was agitated and combative.  They gave no additional medications.  They brought him into the ED.  They noted that he was hypoxic to 70% on scene initially.  Patient has no additional medical information available in our system.  HPI     History reviewed. No pertinent past medical history.  Patient Active Problem List   Diagnosis Date Noted  . Overdose 05/29/2020  . Abnormal head CT 05/29/2020  . Elevated troponin 05/29/2020  . 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 Elevated LFTs 05/29/2020  . Acute metabolic encephalopathy 05/29/2020    History reviewed. No pertinent surgical history.     History reviewed. No pertinent family history.     Home Medications Prior to Admission medications   Not on File    Allergies    Patient has no allergy information on record.  Review of Systems   Review of Systems  Unable to perform ROS: Mental status change (level 5 caveat)    Physical Exam Updated Vital Signs BP 109/81 (BP Location: Right Arm)   Pulse (!) 108   Temp 98.6 F (37 C) (Axillary)   Resp 14   SpO2 96%   Physical Exam Constitutional:       Comments: Grimacing, writhing  HENT:     Head: Normocephalic and atraumatic.     Right Ear: Tympanic membrane and ear canal normal.     Left Ear: Tympanic membrane and ear canal normal.  Eyes:     Conjunctiva/sclera: Conjunctivae normal.     Pupils: Pupils are equal, round, and reactive to light.     Comments: Injected sclera  Cardiovascular:     Rate and Rhythm: Regular rhythm. Tachycardia present.     Pulses: Normal pulses.  Pulmonary:     Effort: Pulmonary effort is normal. No respiratory distress.     Comments: Coarse, wet cough with phlegm 85% on room air Abdominal:     General: There is no distension.     Tenderness: There is no abdominal tenderness. There is no guarding.  Skin:    General: Skin is warm and dry.  Neurological:     GCS: GCS eye subscore is 2. GCS verbal subscore is 4. GCS motor subscore is 6.     Comments: Moving all extremities equally     ED Results / Procedures / Treatments   Labs (all labs ordered are listed, but only abnormal results are displayed) Labs Reviewed  BASIC METABOLIC PANEL - Abnormal; Notable for the following components:      Result Value   Glucose, Bld 118 (*)    BUN 25 (*)    Creatinine, Ser 1.66 (*)  GFR, Estimated 59 (*)    All other components within normal limits  CBC WITH DIFFERENTIAL/PLATELET - Abnormal; Notable for the following components:   WBC 13.7 (*)    Neutro Abs 11.4 (*)    Lymphs Abs 0.6 (*)    Monocytes Absolute 1.5 (*)    Abs Immature Granulocytes 0.10 (*)    All other components within normal limits  HEPATIC FUNCTION PANEL - Abnormal; Notable for the following components:   Total Protein 8.3 (*)    AST 387 (*)    ALT 372 (*)    All other components within normal limits  RAPID URINE DRUG SCREEN, HOSP PERFORMED - Abnormal; Notable for the following components:   Benzodiazepines POSITIVE (*)    Amphetamines POSITIVE (*)    Tetrahydrocannabinol POSITIVE (*)    All other components within normal limits  CK  - Abnormal; Notable for the following components:   Total CK 1,410 (*)    All other components within normal limits  URINALYSIS, ROUTINE W REFLEX MICROSCOPIC - Abnormal; Notable for the following components:   Glucose, UA >=500 (*)    Hgb urine dipstick MODERATE (*)    Protein, ur 30 (*)    All other components within normal limits  CBG MONITORING, ED - Abnormal; Notable for the following components:   Glucose-Capillary 113 (*)    All other components within normal limits  TROPONIN I (HIGH SENSITIVITY) - Abnormal; Notable for the following components:   Troponin I (High Sensitivity) 1,323 (*)    All other components within normal limits  TROPONIN I (HIGH SENSITIVITY) - Abnormal; Notable for the following components:   Troponin I (High Sensitivity) 1,566 (*)    All other components within normal limits  TROPONIN I (HIGH SENSITIVITY) - Abnormal; Notable for the following components:   Troponin I (High Sensitivity) 1,487 (*)    All other components within normal limits  RESP PANEL BY RT-PCR (FLU A&B, COVID) ARPGX2  ETHANOL  AMMONIA  HIV ANTIBODY (ROUTINE TESTING W REFLEX)  CBC  COMPREHENSIVE METABOLIC PANEL  HEMOGLOBIN A1C  LIPID PANEL  CK  TROPONIN I (HIGH SENSITIVITY)    EKG EKG Interpretation  Date/Time:  Thursday May 29 2020 08:02:51 EDT Ventricular Rate:  134 PR Interval:  130 QRS Duration: 89 QT Interval:  295 QTC Calculation: 441 R Axis:   115 Text Interpretation: Sinus tachycardia  No STEMI Confirmed by Alvester Chou [phone]) on 05/29/2020 8:10:38 AM   Radiology DG Cervical Spine 2 or 3 views  Result Date: 05/29/2020 CLINICAL DATA:  Unresponsive patient.  Screening for MRI. EXAM: CERVICAL SPINE - 2-3 VIEW COMPARISON:  None. FINDINGS: There is no evidence of cervical spine fracture or prevertebral soft tissue swelling. Alignment is normal. No other significant bone abnormalities are identified. No metallic or radiopaque foreign body. IMPRESSION: Negative cervical  spine radiographs. Electronically Signed   By: Kennith Center M.D.   On: 05/29/2020 13:53   DG Pelvis 1-2 Views  Result Date: 05/29/2020 CLINICAL DATA:  Pre MRI.  Patient unresponsive. EXAM: PELVIS - 1-2 VIEW COMPARISON:  None. FINDINGS: Contrast noted in the ureters and bladder from a recent chest CT. The bony pelvis is intact. No radiopaque foreign bodies are identified. IMPRESSION: No radiopaque foreign bodies. Electronically Signed   By: Rudie Meyer M.D.   On: 05/29/2020 13:49   DG Abd 1 View  Result Date: 05/29/2020 CLINICAL DATA:  Pre MRI. Patient unresponsive. Evaluate for foreign body that may preclude MRI. EXAM: ABDOMEN - 1 VIEW COMPARISON:  None. FINDINGS: Contrast noted in the renal collecting systems and bladder from a recent CT scan. The abdominal bowel gas pattern is unremarkable. No radiopaque foreign bodies are identified. IMPRESSION: No radiopaque foreign bodies are identified. Electronically Signed   By: Rudie Meyer M.D.   On: 05/29/2020 13:49   CT Head Wo Contrast  Result Date: 05/29/2020 CLINICAL DATA:  Mental status change.  Found unresponsive. EXAM: CT HEAD WITHOUT CONTRAST TECHNIQUE: Contiguous axial images were obtained from the base of the skull through the vertex without intravenous contrast. COMPARISON:  None. FINDINGS: Brain: There is abnormal hypoattenuation involving the right lentiform nucleus and internal capsule. The left basal ganglia appear intact. No acute cortically based infarct, intracranial hemorrhage, mass/mass effect, or extra-axial fluid collection is identified. The ventricles are normal in size. A cavum septum pellucidum is incidentally noted. Vascular: No hyperdense vessel. Skull: No fracture or suspicious osseous lesion. Sinuses/Orbits: Mild mucosal thickening in the included paranasal sinuses. Clear mastoid air cells. Disc conjugate gaze. Other: None IMPRESSION: Low-density in the right basal ganglia suggestive of an acute infarct. Electronically Signed    By: Sebastian Ache M.D.   On: 05/29/2020 10:32   CT Angio Chest PE W and/or Wo Contrast  Result Date: 05/29/2020 CLINICAL DATA:  Unresponsive, hypoxic, high probability PE EXAM: CT ANGIOGRAPHY CHEST WITH CONTRAST TECHNIQUE: Multidetector CT imaging of the chest was performed using the standard protocol during bolus administration of intravenous contrast. Multiplanar CT image reconstructions and MIPs were obtained to evaluate the vascular anatomy. CONTRAST:  OMNIPAQUE IOHEXOL 350 MG/ML SOLN COMPARISON:  None. FINDINGS: Cardiovascular: Heart size normal. No pericardial effusion. Satisfactory opacification of pulmonary arteries noted, and there is no evidence of pulmonary emboli. Motion degrades some of the images through the lung bases. Adequate contrast opacification of the thoracic aorta with no evidence of dissection, aneurysm, or stenosis. There is bovine variant brachiocephalic arch anatomy without proximal stenosis. No significant atheromatous change. Mediastinum/Nodes: No mass or adenopathy. Lungs/Pleura: No pleural effusion. No pneumothorax. Patchy ground-glass and airspace opacities in both lower lobes left greater than right. Scattered ground-glass alveolar opacities in the posterior left upper lobe. Upper Abdomen: No acute abnormality. Musculoskeletal: No chest wall abnormality. No acute or significant osseous findings. Review of the MIP images confirms the above findings. IMPRESSION: 1. Negative for acute PE or thoracic aortic dissection. 2. Patchy bibasilar and posterior left upper lobe infiltrates, nonspecific but possibly related to aspiration. Electronically Signed   By: Corlis Leak M.D.   On: 05/29/2020 10:17   MR BRAIN WO CONTRAST  Result Date: 05/29/2020 EXAM: MRI HEAD WITHOUT CONTRAST TECHNIQUE: Multiplanar, multiecho pulse sequences of the brain and surrounding structures were obtained without intravenous contrast. COMPARISON:  Same day CT head. FINDINGS: Brain: Acute 1.4 cm perforator  infarct in the right putamen/globus pallidus (series 5, image 71) with additional punctate acute infarct in the adjacent anterior limb of the right internal capsule (series 5, image 75). Acute 0.6 cm perforator infarct in the left globus pallidus. Amorphous acute infarct in the left posterior parietal white matter (series 5, image 73). Associated edema without significant mass effect. No acute hemorrhage. No hydrocephalus. Cavum septum pellucidum et vergae, anatomic variant. No mass lesion. No midline shift. Basal cisterns are patent. No extra-axial fluid collection. Vascular: Major arterial flow voids are maintained at the skull base. Skull and upper cervical spine: Diffuse T1 hypointensity of the visualized cervical bone marrow. No focal marrow replacing lesion. Sinuses/Orbits: Mild mucosal thickening of scattered ethmoid air cells, bilateral sphenoid sinuses  and maxillary sinuses with bilateral sphenoid sinus air-fluid levels and frothy secretions. Other: No mastoid effusions. IMPRESSION: 1. 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 Associated edema without significant mass effect. Given involvement of multiple vascular territories, consider embolic etiology. 2. Mild paranasal sinus mucosal thickening with bilateral sphenoid sinus air-fluid levels. 3. Diffuse T1 hypointensity of the visualized cervical bone marrow, which is nonspecific but could be related to chronic anemia, chronic hypoxia (such as in smokers), or less likely a lymphoproliferative disorder. Electronically Signed   By: Feliberto HartsFrederick S Jones MD   On: 05/29/2020 15:10   DG Chest Portable 1 View  Result Date: 05/29/2020 CLINICAL DATA:  evaluate for aspiration PNA, suspected opiod overdose, hypoxic EXAM: PORTABLE CHEST - 1 VIEW COMPARISON:  None available. FINDINGS: The mediastinal contours are within normal limits. No cardiomegaly. Right lower lobe and retrocardiac faint  scattered opacities. No significant pleural effusion or evidence of pneumothorax. No acute osseous abnormality. IMPRESSION: Right lower lobe and retrocardiac faint scattered opacities which are nonspecific but could represent sequela of aspiration, multifocal pneumonia, or subsegmental atelectasis. Electronically Signed   By: Marliss Cootsylan  Suttle MD   On: 05/29/2020 08:35   US Abdomen Limited RUQ (LIVER/GB)  Result Date: 05/29/2020 CLINICAL DATA:  Abnormal LFTs. EXAM: ULTRASOUND ABDOMEN LIMITED RIGHT UPPER QUADRANT COMPARISON:  No prior. FINDINGS: Gallbladder: Exam limited as patient was nonresponsive. Also prominent amount of overlying bowel gas limited the exam. No gallstones or wall thickening visualized. No sonographic Murphy sign noted by sonographer. Common bile duct: Diameter: Not visualized Liver: Increased echogenicity consistent with fatty infiltration and or hepatocellular disease. Decreased attenuation liver adjacent to the gallbladder fossa consistent with focal fatty sparing. Portal vein is patent on color Doppler imaging with normal direction of blood flow towards the liver. Other: Mild ascites. IMPRESSION: Limited exam as above. Diffuse increased hepatic echogenicity consistent fatty infiltration or hepatocellular disease. Probable focal fatty sparing adjacent to the gallbladder fossa. 2.  No gallstones.  Common bile duct not visualized. 3.  Mild ascites. Electronically Signed   By: Maisie Fushomas  Register   On: 05/29/2020 13:36    Procedures .Critical Care Performed by: Terald Sleeperrifan, Lunetta Marina J, MD Authorized by: Terald Sleeperrifan, Clarkson Rosselli J, MD   Critical care provider statement:    Critical care time (minutes):  45   Critical care was necessary to treat or prevent imminent or life-threatening deterioration of the following conditions:  CNS failure or compromise and respiratory failure   Critical care was time spent personally by me on the following activities:  Discussions with consultants, evaluation of patient's  response to treatment, examination of patient, ordering and performing treatments and interventions, ordering and review of laboratory studies, ordering and review of radiographic studies, pulse oximetry, re-evaluation of patient's condition, obtaining history from patient or surrogate and review of old charts Comments:     Stroke evaluation, NSTEMI, aspiration pneumonia - requiring frequent reassessments, neuro assessment, tele monitoring, oxygen titration     Medications Ordered in ED Medications  cefTRIAXone (ROCEPHIN) 2 g in sodium chloride 0.9 % 100 mL IVPB (0 g Intravenous Stopped 05/29/20 1406)  azithromycin (ZITHROMAX) 500 mg in sodium chloride 0.9 % 250 mL IVPB (0 mg Intravenous Stopped 05/29/20 1437)  enoxaparin (LOVENOX) injection 40 mg (has no administration in time range)  lactated ringers infusion ( Intravenous New Bag/Given 05/29/20 1337)  metoprolol tartrate (LOPRESSOR) injection 5 mg (has no administration in time range)   stroke: mapping our early stages of recovery  book (has no administration in time range)  sodium chloride 0.9 % bolus 500 mL (0 mLs Intravenous Stopped 05/29/20 0919)  Ampicillin-Sulbactam (UNASYN) 3 g in sodium chloride 0.9 % 100 mL IVPB (0 g Intravenous Stopped 05/29/20 0859)  LORazepam (ATIVAN) injection 1 mg (1 mg Intravenous Given 05/29/20 1405)  iohexol (OMNIPAQUE) 350 MG/ML injection 100 mL (100 mLs Intravenous Contrast Given 05/29/20 0951)    ED Course  I have reviewed the triage vital signs and the nursing notes.  Pertinent labs & imaging results that were available during my care of the patient were reviewed by me and considered in my medical decision making (see chart for details).  25 year old male presents to ED with altered mental status, confusion, reportedly responded rapidly to Narcan in the field after being found unresponsive.  The patient himself reports he was using fentanyl earlier today.  This is a suspected opioid overdose.  He is hypoxic  on arrival, and has a coarse wet cough, strongly suggestive of aspiration pneumonia.  I ordered a chest x-ray as well as a dose of IV Unasyn.  Patient continues to thrash in the bed and is not cooperative with his work-up.  Have asked a milligram of Ativan be given.  I suspect this is most likely acute withdrawal symptoms from the Ativan that was administered.  There is no immediate evidence of head trauma to suggest traumatic brain injury on exam.  No reported seizure in the field.  We'll need xray, labs, etoh level, UDS if able.  He'll need supplemental oxygen and monitoring.  If mental status is not improving, he may need a CT scan of his brain.  We will reassess and watch him closely.  Clinical Course as of 05/29/20 1720  Thu May 29, 2020  0737 Patient's troponin returns elevated at 1323.  ECG reviewed personally showing sinus tachycardia with no acute ischemic changes or STEMI.  Troponin elevation may be related to demand ischemia associated with a drug use.  I would hold off on heparin at this time until his CT scans are complete.  He remains tachycardic.  I have ordered an emergent CT PE scan, along with a CT head.  He is no longer combative, does appear more drowsy now, now has pinpoint pupils.  I suspect this may be some rebound effect from his fentanyl.  As his oxygen level stable, I do not believe he needs emergent Narcan at this time. [MT]  1036 IMPRESSION: Low-density in the right basal ganglia suggestive of an acute infarct. [MT]  1040 Paged neurology [MT]  1043 I spoke with Dr. Wilford Corner from neurology who states that this particular CT head brain infarct can be seen with fentanyl overdoses.  He recommends is an MRI of the brain to further evaluate for stroke.  Neurology team to be contacted following MRI scan.  In the meantime, I have paged cardiology, and will likely reach out to the critical care regarding admission and possible transfer to [facility].  With his poor mental status and high  aspiration risk, this patient may need higher level of care [MT]  1048 Paged cardiology [MT]  1111 Repeat troponin is relatively stable, with only mild increase to 1566 [MT]  1120 Now satting 100% on room air.  Pinpoint pupils. Will give small dose of narcan, consult hospitalist for step down admission. [MT]  1122 I spoke to Dr Flora Lipps from cardiology who recommends an inpatient echocardiogram, and believes that these elevated troponins are very likely demand ischemia, and significantly less  likely acute coronary syndrome.  Advised no heparin at this time, which I agree with.  I will admit to the hospitalist can pursue an echocardiogram.  If there are EKG changes, continued elevation in troponins, or abnormal findings on echo, they can consider cardiology consultation [MT]  1138 Patient admitted to hospitalist Whitney Post in stable condition, who will order MRI.  Nursing to attempt in and out cath [MT]    Clinical Course User Index [MT] Renaye Rakers Kermit Balo, MD      Final Clinical Impression(s) / ED Diagnoses Altered mental status Drug overdose Polysubstance use NSTEMI Elevated liver enzymes Aspiration pneumonia CVA  Rx / DC Orders ED Discharge Orders    None       Terald Sleeper, MD 05/29/20 1722

## 2020-05-30 ENCOUNTER — Inpatient Hospital Stay (HOSPITAL_COMMUNITY): Payer: Self-pay

## 2020-05-30 DIAGNOSIS — T796XXA Traumatic ischemia of muscle, initial encounter: Secondary | ICD-10-CM

## 2020-05-30 DIAGNOSIS — I6389 Other cerebral infarction: Secondary | ICD-10-CM

## 2020-05-30 LAB — COMPREHENSIVE METABOLIC PANEL
ALT: 451 U/L — ABNORMAL HIGH (ref 0–44)
AST: 330 U/L — ABNORMAL HIGH (ref 15–41)
Albumin: 3.3 g/dL — ABNORMAL LOW (ref 3.5–5.0)
Alkaline Phosphatase: 58 U/L (ref 38–126)
Anion gap: 6 (ref 5–15)
BUN: 12 mg/dL (ref 6–20)
CO2: 25 mmol/L (ref 22–32)
Calcium: 8.6 mg/dL — ABNORMAL LOW (ref 8.9–10.3)
Chloride: 107 mmol/L (ref 98–111)
Creatinine, Ser: 0.81 mg/dL (ref 0.61–1.24)
GFR, Estimated: >60 mL/min (ref >60)
Glucose, Bld: 103 mg/dL — ABNORMAL HIGH (ref 70–99)
Potassium: 3.3 mmol/L — ABNORMAL LOW (ref 3.5–5.1)
Sodium: 138 mmol/L (ref 135–145)
Total Bilirubin: 0.6 mg/dL (ref 0.3–1.2)
Total Protein: 6.2 g/dL — ABNORMAL LOW (ref 6.5–8.1)

## 2020-05-30 LAB — CBC
HCT: 42.5 % (ref 39.0–52.0)
Hemoglobin: 13.7 g/dL (ref 13.0–17.0)
MCH: 29.3 pg (ref 26.0–34.0)
MCHC: 32.2 g/dL (ref 30.0–36.0)
MCV: 90.8 fL (ref 80.0–100.0)
Platelets: 167 10*3/uL (ref 150–400)
RBC: 4.68 MIL/uL (ref 4.22–5.81)
RDW: 13 % (ref 11.5–15.5)
WBC: 12.7 10*3/uL — ABNORMAL HIGH (ref 4.0–10.5)
nRBC: 0 % (ref 0.0–0.2)

## 2020-05-30 LAB — ECHOCARDIOGRAM COMPLETE
AR max vel: 2.45 cm2
AV Area VTI: 2.49 cm2
AV Area mean vel: 2.27 cm2
AV Mean grad: 3 mmHg
AV Peak grad: 6 mmHg
Ao pk vel: 1.22 m/s
S' Lateral: 3.8 cm
Weight: 2793.67 oz

## 2020-05-30 LAB — RPR: RPR Ser Ql: NONREACTIVE

## 2020-05-30 LAB — CK
Total CK: 2189 U/L — ABNORMAL HIGH (ref 49–397)
Total CK: 1631 U/L — ABNORMAL HIGH (ref 49–397)

## 2020-05-30 MED ORDER — LORAZEPAM 1 MG PO TABS
1.0000 mg | ORAL_TABLET | Freq: Every day | ORAL | Status: DC
Start: 2020-06-03 — End: 2020-05-30

## 2020-05-30 MED ORDER — LORAZEPAM 2 MG/ML IJ SOLN
0.0000 mg | Freq: Two times a day (BID) | INTRAMUSCULAR | Status: DC
Start: 2020-06-01 — End: 2020-05-31

## 2020-05-30 MED ORDER — ADULT MULTIVITAMIN W/MINERALS CH
1.0000 | ORAL_TABLET | Freq: Every day | ORAL | Status: DC
  Administered 2020-05-30: 1 via ORAL
  Filled 2020-05-30: qty 1

## 2020-05-30 MED ORDER — LORAZEPAM 2 MG/ML IJ SOLN
0.0000 mg | Freq: Four times a day (QID) | INTRAMUSCULAR | Status: DC
  Administered 2020-05-30: 2 mg via INTRAVENOUS
  Filled 2020-05-30: qty 1

## 2020-05-30 MED ORDER — LORAZEPAM 1 MG PO TABS: 1.0000 mg | ORAL_TABLET | Freq: Three times a day (TID) | ORAL | Status: DC

## 2020-05-30 MED ORDER — PERFLUTREN LIPID MICROSPHERE
1.0000 mL | INTRAVENOUS | Status: AC | PRN
  Administered 2020-05-30: 3 mL via INTRAVENOUS
  Filled 2020-05-30: qty 10

## 2020-05-30 MED ORDER — LOPERAMIDE HCL 2 MG PO CAPS: 2.0000 mg | ORAL_CAPSULE | ORAL | Status: DC | PRN

## 2020-05-30 MED ORDER — HYDROXYZINE HCL 25 MG PO TABS
25.0000 mg | ORAL_TABLET | Freq: Four times a day (QID) | ORAL | Status: DC | PRN
Start: 2020-05-30 — End: 2020-05-31

## 2020-05-30 MED ORDER — CLONIDINE HCL 0.1 MG PO TABS: 0.1000 mg | ORAL_TABLET | Freq: Every day | ORAL | Status: DC

## 2020-05-30 MED ORDER — CLONIDINE HCL 0.1 MG PO TABS: 0.1000 mg | ORAL_TABLET | ORAL | Status: DC

## 2020-05-30 MED ORDER — NAPROXEN 250 MG PO TABS
500.0000 mg | ORAL_TABLET | Freq: Two times a day (BID) | ORAL | Status: DC | PRN
  Filled 2020-05-30: qty 2

## 2020-05-30 MED ORDER — LORAZEPAM 1 MG PO TABS: 1.0000 mg | ORAL_TABLET | Freq: Four times a day (QID) | ORAL | Status: DC | PRN

## 2020-05-30 MED ORDER — METHOCARBAMOL 500 MG PO TABS: 500.0000 mg | ORAL_TABLET | Freq: Three times a day (TID) | ORAL | Status: DC | PRN

## 2020-05-30 MED ORDER — ONDANSETRON 4 MG PO TBDP: 4.0000 mg | ORAL_TABLET | Freq: Four times a day (QID) | ORAL | Status: DC | PRN

## 2020-05-30 MED ORDER — DICYCLOMINE HCL 20 MG PO TABS
20.0000 mg | ORAL_TABLET | Freq: Four times a day (QID) | ORAL | Status: DC | PRN
  Filled 2020-05-30: qty 1

## 2020-05-30 MED ORDER — POTASSIUM CHLORIDE 10 MEQ/100ML IV SOLN
10.0000 meq | INTRAVENOUS | Status: AC
  Administered 2020-05-30 (×4): 10 meq via INTRAVENOUS
  Filled 2020-05-30 (×4): qty 100

## 2020-05-30 MED ORDER — CLONIDINE HCL 0.1 MG PO TABS
0.1000 mg | ORAL_TABLET | Freq: Four times a day (QID) | ORAL | Status: DC
  Administered 2020-05-30 (×2): 0.1 mg via ORAL
  Filled 2020-05-30 (×2): qty 1

## 2020-05-30 MED ORDER — LORAZEPAM 1 MG PO TABS: 1.0000 mg | ORAL_TABLET | Freq: Four times a day (QID) | ORAL | Status: DC

## 2020-05-30 MED ORDER — LORAZEPAM 1 MG PO TABS: 1.0000 mg | ORAL_TABLET | Freq: Two times a day (BID) | ORAL | Status: DC

## 2020-05-30 MED ORDER — THIAMINE HCL 100 MG/ML IJ SOLN
100.0000 mg | Freq: Once | INTRAMUSCULAR | Status: AC
Start: 2020-05-30 — End: 2020-05-30
  Administered 2020-05-30: 100 mg via INTRAMUSCULAR
  Filled 2020-05-30: qty 2

## 2020-05-30 MED ORDER — IBUPROFEN 400 MG PO TABS
400.0000 mg | ORAL_TABLET | Freq: Once | ORAL | Status: AC
  Administered 2020-05-30: 400 mg via ORAL
  Filled 2020-05-30: qty 1

## 2020-05-30 MED ORDER — LORAZEPAM 1 MG PO TABS: 1.0000 mg | ORAL_TABLET | ORAL | Status: DC | PRN

## 2020-05-30 MED ORDER — LORAZEPAM 2 MG/ML IJ SOLN
1.0000 mg | INTRAMUSCULAR | Status: DC | PRN
Start: 2020-05-30 — End: 2020-05-31

## 2020-05-30 NOTE — Progress Notes (Signed)
  Echocardiogram 2D Echocardiogram has been performed.  Gerda Diss 05/30/2020, 3:35 PM

## 2020-05-30 NOTE — Progress Notes (Signed)
Rehab Admissions Coordinator Note:  Patient was screened by Clois Dupes for appropriateness for an Inpatient Acute Rehab Consult per therapy recs. High functional level on evaluation. If patient doe snot progress quickly over the next 24 to 48 hrs, we will assess for possible need for CIR/Rehab consult. I will not place rehab consult order today.Clois Dupes RN MSN 05/30/2020, 11:00 AM  I can be reached at [phone].

## 2020-05-30 NOTE — TOC CAGE-AID Note (Signed)
Transition of Care [facility]) - CAGE-AID Screening   Patient Details  Name: Garrett West MRN: [id number] Date of Birth: 09/21/1995  [facility] CM/SW Contact:    Mearl Latin, LCSW Phone Number: 05/30/2020, 9:38 AM   Clinical Narrative: Patient currently disoriented and unable to participate in screening at this time.    CAGE-AID Screening: Substance Abuse Screening unable to be completed due to: : Patient unable to participate

## 2020-05-30 NOTE — Progress Notes (Signed)
Called poison control per MD. No recommendations at this time. Notified MD.

## 2020-05-30 NOTE — Evaluation (Signed)
Occupational Therapy Evaluation Patient Details Name: Garrett West MRN: [id number] DOB: 1995/05/30 Today's Date: 05/30/2020    History of Present Illness 24yo male admitted 05/29/20 after being found unresponsive in a parking lot. Immediately responded to Narcan given by EMS, thought to have overdosed on fentanyl. 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 No known PMH   Clinical Impression   Pt presents with decline in function and safety with ADLs and ADL mobility with impaired strength, balance, endurance cognition, safety awareness, L UE AROM (NO AROM of L shoulder), L UE coordination and is impulsive with poor insight into deficits. 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 Pt currently requires min guard A to stand and with ADL mobility, min A with LB ADLs,min guard A with UB ADLs seated. Pt requires mod verbal and physical cues for tasks and for safety. Pt would benefit from acute OT services to address impairments to maximize level of function and safety    Follow Up Recommendations  CIR, 24 hour supervision   Equipment Recommendations  Other (comment) (TBD at next venue of care)    Recommendations for Other Services Rehab consult     Precautions / Restrictions Precautions Precautions: Fall;Other (comment) Precaution Comments: impulsive and with very poor understanding of deficits, LUE weakness Restrictions Weight Bearing Restrictions: No      Mobility Bed Mobility Overal bed mobility: Modified Independent             General bed mobility comments: increased time and effort, assist to manage lines    Transfers Overall transfer level: Needs assistance Equipment used: None Transfers: Sit to/from Stand Sit to Stand: Min guard         General transfer comment: min guard to boost to full upright, once standing he needed MinA for balance    Balance Overall balance  assessment: Needs assistance Sitting-balance support: Bilateral upper extremity supported;Feet supported Sitting balance-Leahy Scale: Fair Sitting balance - Comments: tends to drift posteriorly with dynamic tasks Postural control: Posterior lean Standing balance support: No upper extremity supported;During functional activity Standing balance-Leahy Scale: Poor                             ADL either performed or assessed with clinical judgement   ADL Overall ADL's : Needs assistance/impaired Eating/Feeding: Set up;Sitting   Grooming: Wash/dry hands;Wash/dry face;Minimal assistance;Standing Grooming Details (indicate cue type and reason): Poor dynamic balance and sink Upper Body Bathing: Min guard;Sitting Upper Body Bathing Details (indicate cue type and reason): simulated with mod verbal cues Lower Body Bathing: Sitting/lateral leans;Sit to/from stand;Cueing for safety;Minimal assistance;Min guard Lower Body Bathing Details (indicate cue type and reason): simulated Upper Body Dressing : Min guard;Sitting   Lower Body Dressing: Minimal assistance;Min guard;Sit to/from stand Lower Body Dressing Details (indicate cue type and reason): Poor dynamic balance Toilet Transfer: Min guard;Ambulation;Cueing for safety;Regular Toilet;Grab bars   Toileting- Clothing Manipulation and Hygiene: Min guard;Sit to/from stand       Functional mobility during ADLs: Min guard;Rolling walker;Cueing for safety General ADL Comments: Pt impulsive with poor dynamic balance, impaired AROM and coordination of L UE     Vision         Perception     Praxis      Pertinent Vitals/Pain Pain Assessment: No/denies pain     Hand Dominance     Extremity/Trunk Assessment  Upper Extremity Assessment Upper Extremity Assessment: Generalized weakness;LUE deficits/detail LUE Deficits / Details: shoulder flexion and abduction 2/5, biceps and triceps, grip strength 4-/5. pt kept trying to compensate  with other shoulder muscles to flex and abduct   Lower Extremity Assessment Lower Extremity Assessment: Defer to PT evaluation   Cervical / Trunk Assessment Cervical / Trunk Assessment: Normal   Communication     Cognition Arousal/Alertness: Awake/alert Behavior During Therapy: Flat affect;Anxious;Impulsive Overall Cognitive Status: Impaired/Different from baseline Area of Impairment: Attention;Memory;Following commands;Safety/judgement;Awareness;Problem solving                     Memory: Decreased short-term memory Following Commands: Follows one step commands consistently;Follows one step commands with increased time Safety/Judgement: Decreased awareness of safety;Decreased awareness of deficits   Problem Solving: Requires verbal cues;Requires tactile cues General Comments: initially lethargic but became more alert with constant verbal and tactile stimulation; very anxious , says he is "I'm fine, not that bad, will be fine"   General Comments       Exercises     Shoulder Instructions      Home Living Family/patient expects to be discharged to:: Private residence Living Arrangements: Spouse/significant other;Children Available Help at Discharge: Family;Available PRN/intermittently Type of Home: Apartment (unclear at this time) Home Access:  (unclear at this time)           Bathroom Shower/Tub: Walk-in shower;Tub/shower unit         Home Equipment: None   Additional Comments: some of PLOF history was unclear as he was perseverating on needing to go home to take care of children/easily distractable; from what chart provides, he works as a Education administrator, 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 No physical impairments before.  Lives With: Significant other;Family (so and three children)    Prior Functioning/Environment                   OT Problem List: Decreased strength;Impaired balance (sitting and/or standing);Decreased cognition;Decreased  knowledge of precautions;Decreased safety awareness;Decreased range of motion;Decreased activity tolerance;Decreased coordination;Decreased knowledge of use of DME or AE;Impaired UE functional use      OT Treatment/Interventions: Self-care/ADL training;Therapeutic exercise;Patient/family education;Balance training;Neuromuscular education;Therapeutic activities;DME and/or AE instruction    OT Goals(Current goals can be found in the care plan section) Acute Rehab OT Goals Patient Stated Goal: go home and see my kids OT Goal Formulation: With patient Time For Goal Achievement: 06/13/20 Potential to Achieve Goals: Good ADL Goals Pt Will Perform Grooming: with set-up;with supervision Pt Will Perform Lower Body Bathing: with min guard assist;sitting/lateral leans;sit to/from stand Pt Will Perform Upper Body Dressing: with supervision;with set-up Pt Will Perform Lower Body Dressing: with min guard assist;sit to/from stand Pt Will Transfer to Toilet: with supervision;ambulating Additional ADL Goal #1: Pt will tolerate self ROM of L shoulder using R UE to assist for improving functional use Additional ADL Goal #2: Pt will verbalize and dmeo 2/3 safety techniques with 2-3 multimodal cues during ADLs and ADL mobility  OT Frequency: Min 2X/week   Barriers to D/C:            Co-evaluation              AM-PAC OT "6 Clicks" Daily Activity     Outcome Measure Help from another person eating meals?: A Little Help from another person taking care of personal grooming?: A Little Help from another person toileting, which includes using toliet, bedpan, or urinal?: A Lot Help from another person  bathing (including washing, rinsing, drying)?: A Lot Help from another person to put on and taking off regular upper body clothing?: A Lot Help from another person to put on and taking off regular lower body clothing?: A Lot 6 Click Score: 14   End of Session Equipment Utilized During Treatment: Gait  belt Nurse Communication: Mobility status  Activity Tolerance: Patient limited by lethargy Patient left: in bed;with call bell/phone within reach;with bed alarm set  OT Visit Diagnosis: Unsteadiness on feet (R26.81);Other abnormalities of gait and mobility (R26.89);Muscle weakness (generalized) (M62.81);Other symptoms and signs involving cognitive function                Time: 8938-1017 OT Time Calculation (min): 25 min Charges:  OT General Charges $OT Visit: 1 Visit OT Evaluation $OT Eval Moderate Complexity: 1 Mod OT Treatments $Therapeutic Activity: 8-22 mins    Galen Manila 05/30/2020, 2:45 PM

## 2020-05-30 NOTE — Evaluation (Signed)
Clinical/Bedside Swallow Evaluation Patient Details  Name: Garrett West MRN: [id number] Date of Birth: [birth year]  Today's Date: 05/30/2020 Time: SLP Start Time (ACUTE ONLY): 1038 SLP Stop Time (ACUTE ONLY): 1049 SLP Time Calculation (min) (ACUTE ONLY): 11.28 min  Past Medical History: History reviewed. No pertinent past medical history. Past Surgical History: History reviewed. No pertinent surgical history. HPI:  Pt is a 25 year old Spanish-speaking male who presented to the ED via EMS after being found unresponsive in a parking lot suspected from a fentanyl overdose. Narcan administered by EMS. 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 [facility] completed 5/20 and pt failed due to coughing.   Assessment / Plan / Recommendation Clinical Impression  Pt was seen for bedside swallow evaluation and he denied a history of dysphagia. Oral mechanism exam was [facility] and he presented with adequate, natural dentition. Pt demonstrated anterior spillage to the right with thin liquids, but no other symptoms of  oropharyngeal dysphagia were noted. Pt demonstrated impulsive tendencies throughout the evaluation. SLP questions whether anterior spillage was due to impulsivity or reduced labial seal.  Upon inquiry, pt stated that he was eating rapidly to show that he can swallow without difficulty since he is eager to go home and see his significant other and children. A regular texture diet with thin liquids is recommended at this time with intermittent supervision to cue for rate. SLP will follow pt to ensure diet tolerance. SLP Visit Diagnosis: Dysphagia, unspecified (R13.10)    Aspiration Risk  Mild aspiration risk    Diet Recommendation Regular;Thin liquid   Liquid Administration via: Cup;Straw Medication Administration: Whole meds with liquid Supervision: Patient able to self feed Compensations: Slow rate;Minimize environmental  distractions Postural Changes: Seated upright at 90 degrees    Other  Recommendations Oral Care Recommendations: Oral care BID   Follow up Recommendations  (TBD)      Frequency and Duration min 1 x/week  1 week       Prognosis Prognosis for Safe Diet Advancement: Good Barriers to Reach Goals: Cognitive deficits      Swallow Study   General Date of Onset: 05/29/20 HPI: Pt is a 25 year old Spanish-speaking male who presented to the ED via EMS after being found unresponsive in a parking lot suspected from a fentanyl overdose. Narcan administered by EMS. 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 [facility] completed 5/20 and pt failed due to coughing. Type of Study: Bedside Swallow Evaluation Previous Swallow Assessment: none Diet Prior to this Study: NPO Temperature Spikes Noted: No Respiratory Status: Room air History of Recent Intubation: No Behavior/Cognition: Alert;Cooperative;Pleasant mood Oral Cavity Assessment: Within Functional Limits Oral Care Completed by SLP: No Oral Cavity - Dentition: Adequate natural dentition Vision: Functional for self-feeding Self-Feeding Abilities: Able to feed self Patient Positioning: Upright in bed Baseline Vocal Quality: Normal Volitional Cough: Strong Volitional Swallow: Able to elicit    Oral/Motor/Sensory Function Overall Oral Motor/Sensory Function: Within functional limits   Ice Chips Ice chips: Within functional limits Presentation: Spoon   Thin Liquid Thin Liquid: Impaired Presentation: Cup;Straw Oral Phase Impairments: Reduced labial seal Oral Phase Functional Implications: Right anterior spillage    Nectar Thick Nectar Thick Liquid: Not tested   Honey Thick Honey Thick Liquid: Not tested   Puree Puree: Within functional limits Presentation: Spoon   Solid     Solid: Within functional limits Presentation: Self Fed     [employer]  Jodi Marble, MS,  CCC-SLP Acute Rehabilitation Services Office number [phone] Pager [phone]  Scheryl Marten 05/30/2020,11:15 AM

## 2020-05-30 NOTE — Progress Notes (Signed)
Patient reporting daily use smoking crystal meth, taking xanax, and sniffing fentanyl. Patient reporting that his skin is crawling. Notified MD.

## 2020-05-30 NOTE — Progress Notes (Signed)
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  We will initiate clonidine withdrawal protocol as well as empirically started alcohol withdrawal protocol with lorazepam as this will cover a Xanax withdrawal.  We will need to monitor him carefully

## 2020-05-30 NOTE — Evaluation (Signed)
Speech Language Pathology Evaluation Patient Details Name: Garrett West MRN: [id number] DOB: [birth year] Today's Date: 05/30/2020 Time: 1050-1108 SLP Time Calculation (min) (ACUTE ONLY): 18 min  Problem List:  Patient Active Problem List   Diagnosis Date Noted  . Overdose 05/29/2020  . Abnormal head CT 05/29/2020  . Elevated troponin 05/29/2020  . 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 Elevated LFTs 05/29/2020  . Acute metabolic encephalopathy 05/29/2020  . Cerebrovascular accident (CVA) [facility])    Past Medical History: History reviewed. No pertinent past medical history. Past Surgical History: History reviewed. No pertinent surgical history. HPI:  Pt is a 25 year old Spanish-speaking male who presented to the ED via EMS after being found unresponsive in a parking lot suspected from a fentanyl overdose. Narcan administered by EMS. 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 [facility] completed 5/20 and pt failed due to coughing.   Assessment / Plan / Recommendation Clinical Impression  Pt participated in speech/language/cognition evaluation. Pt denied any baseline or acute deficits in speech, language, or cognition. 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 [facility] Mental Status Examination was completed to evaluate the pt's cognitive-linguistic skills. He achieved a score of 17/30 which is below the normal limits of 25 or more out of 30. He exhibited deficits in the areas of awareness, attention, memory, orientation, and executive function during both formal and informal tasks. Speech and language skills were WNL. Skilled SLP services are clinically indicated at this time to improve cognitive-linguistic function.    SLP Assessment  SLP Recommendation/Assessment: Patient needs continued Speech Lanaguage Pathology Services SLP Visit Diagnosis:  Cognitive communication deficit (R41.841)    Follow Up Recommendations  Inpatient Rehab    Frequency and Duration min 2x/week  2 weeks      SLP Evaluation Cognition  Overall Cognitive Status: Impaired/Different from baseline Arousal/Alertness: Awake/alert Orientation Level: Oriented to person;Oriented to place;Disoriented to time (Date: July 30, 2020) Attention: Sustained;Focused Focused Attention: Appears intact Sustained Attention: Impaired Sustained Attention Impairment: Verbal complex Memory: Impaired Memory Impairment: Storage deficit;Retrieval deficit;Decreased recall of new information (Immediate: 5/5; delayed: 4/5; with cue: 1/1) Awareness: Impaired Awareness Impairment: Intellectual impairment Problem Solving: Impaired Problem Solving Impairment: Verbal complex;Verbal basic Executive Function: Sequencing;Reasoning;Organizing Sequencing: Impaired Organizing: Impaired Organizing Impairment: Verbal complex (backward digit span: 1/2) Behaviors: Restless;Impulsive Safety/Judgment: Impaired       Comprehension  Auditory Comprehension Overall Auditory Comprehension: Appears within functional limits for tasks assessed Yes/No Questions: Within Functional Limits Commands: Within Functional Limits    Expression Verbal Expression Overall Verbal Expression: Appears within functional limits for tasks assessed Initiation: No impairment Level of Generative/Spontaneous Verbalization: Conversation Repetition: No impairment Naming: No impairment   Oral / Motor  Oral Motor/Sensory Function Overall Oral Motor/Sensory Function: Within functional limits Motor Speech Overall Motor Speech: Appears within functional limits for tasks assessed Respiration: Within functional limits Phonation: Normal Resonance: Within functional limits Articulation: Within functional limitis Intelligibility: Intelligible Motor Planning: Witnin functional limits Motor Speech Errors: Not applicable    Tikia Skilton I. Vear Clock, MS, CCC-SLP Acute Rehabilitation Services Office number [phone] Pager [phone]           Scheryl Marten 05/30/2020, 11:30 AM

## 2020-05-30 NOTE — Evaluation (Signed)
Physical Therapy Evaluation Patient Details Name: Garrett West MRN: [id number] DOB: 09/21/1995 Today's Date: 05/30/2020   History of Present Illness  25yo male admitted 05/29/20 after being found unresponsive in a parking lot. Immediately responded to Narcan given by EMS, thought to have overdosed on fentanyl. 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 No known PMH  Clinical Impression   Patient received in bed, initially lethargic but with sustained verbal and tactile stimulation he was able to wake up and participate in therapy. Generally needs MinA for balance and safety with dynamic tasks due to gross unsteadiness, LUE is also significantly weak at the shoulder. He has very poor awareness of safety and physical impairments, and repeatedly asks me to tell the MD he is back to normal and nothing is wrong so he can see his kids and wife. Needed frequent cues to reduce impulsivity. Left in bed with all needs met, bed alarm active. May benefit from intensive therapy services in CIR setting prior to return home.     Follow Up Recommendations CIR;Supervision/Assistance - 24 hour    Equipment Recommendations  Rolling walker with 5" wheels    Recommendations for Other Services       Precautions / Restrictions Precautions Precautions: Fall;Other (comment) Precaution Comments: impulsive and with very poor understanding of deficits, LUE weakness Restrictions Weight Bearing Restrictions: No      Mobility  Bed Mobility Overal bed mobility: Modified Independent             General bed mobility comments: increased time and effort, assist to manage lines    Transfers Overall transfer level: Needs assistance Equipment used: None Transfers: Sit to/from Stand Sit to Stand: Min guard         General transfer comment: min guard to boost to full upright, once standing he needed MinA for balance  Ambulation/Gait Ambulation/Gait assistance:  Min assist Gait Distance (Feet): 120 Feet Assistive device: None Gait Pattern/deviations: Step-through pattern;Narrow base of support;Drifts right/left Gait velocity: impulsive   General Gait Details: impulsive with gait, requires frequent VC to slow down; occasional R/L drift and required MinA for general balance especially when turning  Stairs            Wheelchair Mobility    Modified Rankin (Stroke Patients Only)       Balance Overall balance assessment: Needs assistance Sitting-balance support: Bilateral upper extremity supported;Feet supported Sitting balance-Leahy Scale: Fair Sitting balance - Comments: tends to drift posteriorly with dynamic tasks Postural control: Posterior lean Standing balance support: No upper extremity supported;During functional activity Standing balance-Leahy Scale: Poor Standing balance comment: MinA for dynamic balance                             Pertinent Vitals/Pain Pain Assessment: No/denies pain    Home Living Family/patient expects to be discharged to:: Private residence Living Arrangements: Spouse/significant other;Children Available Help at Discharge: Family;Available PRN/intermittently Type of Home:  (unclear) Home Access:  (unclear)     Home Layout:  (unclear) Home Equipment: None Additional Comments: some of PLOF history was unclear as he was perseverating on needing to go home to take care of children/easily distractable; from what I understand, he works as a Curator, 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 No physical impairments before.    Prior Function Level of Independence: Independent  Hand Dominance        Extremity/Trunk Assessment   Upper Extremity Assessment Upper Extremity Assessment: LUE deficits/detail LUE Deficits / Details: shoulder flexion and abduction 2/5, biceps and triceps at least 4/5, grip strength almost equal to that of R UE    Lower Extremity  Assessment Lower Extremity Assessment: Overall WFL for tasks assessed    Cervical / Trunk Assessment Cervical / Trunk Assessment: Normal  Communication   Communication: No difficulties  Cognition Arousal/Alertness: Awake/alert Behavior During Therapy: Flat affect;Anxious Overall Cognitive Status: Impaired/Different from baseline Area of Impairment: Attention;Memory;Following commands;Safety/judgement;Awareness;Problem solving                   Current Attention Level: Sustained Memory: Decreased short-term memory Following Commands: Follows one step commands consistently;Follows one step commands with increased time Safety/Judgement: Decreased awareness of safety;Decreased awareness of deficits Awareness: Intellectual Problem Solving: Requires verbal cues General Comments: initially lethargic but became more alert with ongoing verbal and tactile stimulation; very anxious and kept begging me to say he is OK so he can go home and see his kids. Very poor awareness of safety and deficits.      General Comments General comments (skin integrity, edema, etc.): poor signal on SpO2 pleth, HR monitor pads had come off during gait  so unable to get accurate HR but no signs of distress noted    Exercises     Assessment/Plan    PT Assessment Patient needs continued PT services  PT Problem List Decreased strength;Decreased cognition;Decreased knowledge of use of DME;Decreased safety awareness;Decreased activity tolerance;Decreased balance;Decreased knowledge of precautions;Decreased mobility;Decreased coordination       PT Treatment Interventions DME instruction;Balance training;Gait training;Neuromuscular re-education;Stair training;Functional mobility training;Patient/family education;Cognitive remediation;Therapeutic activities;Therapeutic exercise    PT Goals (Current goals can be found in the Care Plan section)  Acute Rehab PT Goals Patient Stated Goal: go home and see my  kids PT Goal Formulation: With patient Time For Goal Achievement: 06/13/20 Potential to Achieve Goals: Good    Frequency Min 4X/week   Barriers to discharge        Co-evaluation               AM-PAC PT "6 Clicks" Mobility  Outcome Measure Help needed turning from your back to your side while in a flat bed without using bedrails?: None Help needed moving from lying on your back to sitting on the side of a flat bed without using bedrails?: None Help needed moving to and from a bed to a chair (including a wheelchair)?: A Little Help needed standing up from a chair using your arms (e.g., wheelchair or bedside chair)?: A Little Help needed to walk in hospital room?: A Little Help needed climbing 3-5 steps with a railing? : A Lot 6 Click Score: 19    End of Session Equipment Utilized During Treatment: Gait belt Activity Tolerance: Patient tolerated treatment well Patient left: in bed;with call bell/phone within reach;with bed alarm set Nurse Communication: Mobility status PT Visit Diagnosis: Unsteadiness on feet (R26.81);Muscle weakness (generalized) (M62.81);Difficulty in walking, not elsewhere classified (R26.2)    Time: 0935-1002 PT Time Calculation (min) (ACUTE ONLY): 27 min   Charges:   PT Evaluation $PT Eval Moderate Complexity: 1 Mod PT Treatments $Gait Training: 8-22 mins        Kristen U PT, DPT, PN1   Supplemental Physical Therapist [place]    Pager [phone] Acute Rehab Office [phone]    

## 2020-05-30 NOTE — Progress Notes (Signed)
PROGRESS NOTE    Garrett West  ZOX:[id number] DOB: [birth year] DOA: 05/29/2020 PCP: Pcp, No   Brief Narrative: The patient is a Hispanic 25 year old male with no real past medical history who presented to the ED via EMS after he was found unresponsive in a parking lot earlier yesterday morning suspected from a fentanyl overdose.  It is unclear how long the patient was found down.  Fire rescue administered intranasal Narcan prior to arrival to the ED patient immediately responded.  He was also noted to be hypoxic at 70% initially on upon the scene.  Upon arrival to ED patient was confused, agitated and combative.  Translator in the ED was able to help the ED provider and obtain some of the history the patient reported that he uses fentanyl the night before.  He continued to thrash in the bed and was not cooperative during the ED provider work-up and but then given Ativan due to his withdrawal symptoms.  He was initially placed on 2 L of nasal cannula and O2 saturations improved to 100%.  He is noted to have an elevated creatinine and abnormal LFTs and a troponin level that was elevated and trended up to 1566.  WBC was 13.7.  He had a chest x-ray showed a right lower lobe retrocardiac faint scattered opacity.  CTA of the chest was negative for PE but did show patchy bibasilar and posterior left upper lobe infiltrates likely related to aspiration.  Patient had a head CT which showed low-density in right basal ganglia suggestive of an acute infarct.  The ED provider spoke with the cardiologist on-call who felt that his troponin elevation was certainly demand ischemia and recommended echo as well as formal cardiology consult if echo is abnormal.  ED provider also spoke with neurology about the CT findings who recommended brain MRI transfer to [facility] as well as further work-up including EEG.  In the ED received a 500 normal saline bolus, started on Unasyn and was given Narcan prior to arrival.  Once  patient arrived to [facility] he was evaluated and an MRI brain was done which showed an acute perforated infarct in the bilateral basal ganglia and acute left frontal white matter infarct associated with edema without significant mass-effect and given his multiple vascular territories they considered embolic etiology.  Patient also had a mild paranasal sinus mucosal thickening and a diffuse T1 hypointensity of the visualized cervical bone marrow which is nonspecific but could be related to chronic anemia or chronic hypoxia less likely lymphoproliferative disorder.  Neurology evaluated and given his multiple strokes in the setting of polysubstance abuse likely related to vasospasm secondary to stimulant use however given his high risky behavior they felt that he could have a potential for neurosyphilis.  They recommended continuing stroke work-up and obtaining a TTE and as well as a TEE.  Stroke labs were initiated and they recommended an MRI of the brain without contrast and as well as an MRI of the neck with and without contrast.  They are going to obtain a carotid Doppler as well as the MRA neck was to motion limited as well as obtain echocardiogram and frequent neurochecks.  Patient was given prophylactic therapy and given aspirin 325 mg followed by 81 mg daily as well as considering Plavix load with 300 mg and then 75 mg daily for 21 days if LP is not needed.  Given his encephalopathy B12, thiamine and HIV as well as ammonia versus to see reversible causes and he  started on empiric thiamine supplementation 5 mg IV daily for 3 days and then 100 mg daily.  CK was significantly elevated so he was given IV normal saline.  PT and OT recommending CIR recommended CIR and a regular diet with thin liquids.  Currently his echo was done and neurology is following  Assessment & Plan:   Principal Problem:   Overdose Active Problems:   Abnormal head CT   Elevated troponin   Rhabdomyolysis   Elevated LFTs    Acute metabolic encephalopathy   Cerebrovascular accident (CVA) (HCC)  Fentanyl Overdose -S/p narcan x1 with initial improvement but seemed to go through withdrawal in the ED so received ativan and now sleeping, protecting airway; He remains somnolent this AM  -Advise against recreational drugs once awake -UDS ordered and was POSITIVE For Amphetamines, Benzodiazepines, and THC and admitted to Fentanyl use to the EDP -Continue to monitor closely  Concern for aspiration event with Developing Aspiration Pneumonia -CTA chest with patchy bibasilar and posterior LUL infiltrates -Leukocytosis and tachycardia (could be from infection vs. Reactive) -Empiric Ceftriaxone and azithromycin for now; Recieved -Aspiration precautions  Acute Multiple CVA's  -In the setting of his Polysubstance Abuse  -Initial Head CT done and showed Low density in the right basal ganglia suggestive of an acute infarct -Neurology aware and recommends MRI brain to ensure that there is no stroke or changes associated with fentanyl overdose. -MRI done and showed "Acute perforator infarcts in bilateral basal ganglia and acute left parietal white matter infarct, as detailed above. Associated edema without significant mass effect. Given involvement of multiple vascular territories, consider embolic etiology. Mild paranasal sinus mucosal thickening with bilateral sphenoid sinus air-fluid levels. Diffuse T1 hypointensity of the visualized cervical bone marrow, which is nonspecific but could be related to chronic anemia, chronic hypoxia (such as in smokers), or less likely a lymphoproliferative disorder." -Checking ECHO and has been done and pending Read -Neurology recommending continuing stroke work-up and obtaining a TEE if TTE is negative.   -Stroke labs were initiated and they recommended an MRA of the brain without contrast and as well as an MRI of the neck with and without contrast.   -They are going to obtain a carotid Doppler  only if the MRA neck is too motion limited as well  -Frequent neurochecks.   -Patient was given prophylactic therapy and given aspirin 325 mg followed by 81 mg daily as well as considering Plavix load with 300 mg and then 75 mg daily for 21 days if LP is not needed. -Will check Lipid panel and HbA1c and Neurology recommending RPR and if it is Positive consider LP for Neurosyphilis Eval -Lipid panel done and showed a total cholesterol/HDL ratio 2.9, cholesterol level 132, HDL 45, LDL 76, triglycerides of 54, VLDL of 11 -Hemoglobin A1c was 6.0 -C/w Telemetry monitoring -PT/OT/SLP recommending SNF -Further Care per Neurology   Elevated troponin, suspect demand ischemia -Trop 1323 -> 1566 -> 1487 -> 1317, EKG Sinus tachycardia without pathologic ST changes -ED provider discussed with cardiology who felt this was almost certainly demand ischemia, recommended an echo and formal cardiology consult if echo was abnormal -ECHOCardiogram pending read  Mild Rhabdomyolysis -Unclear etiology but likley from Drug overdose -CK went from 1410 -> 2772 -> 2189 -C/w IV fluids with NS at 200 mL/hr and follow up in AM -Repeat CK in AM  Hyperammonemia -Patient's ammonia level went from 30 and is now 47 and repeat still pending  Prediabetes -Patient's hemoglobin A1c is 6.0 -Continue monitor  blood sugars carefully and if necessary will place on sensitive NovoLog sliding scale insulin AC  Abnormal/Elevated LFTs -Likely in the setting of rhabdo and drug abuse -Patient's AST on admission was 387 and then trended down to 330 Patient's ALT was 372 and trended up to 451 -U/S of the Abdomen showed "Limited exam as above. Diffuse increased hepatic echogenicity consistent fatty infiltration or hepatocellular disease. Probable focal fatty sparing adjacent to the gallbladder fossa.   No gallstones.  Common bile duct not visualized.  Mild ascites." -If continue to worsen may need Gastroenterology    AKI -Unknown  baseline, suspect slight AKI -Patient's BUNs/creatinine went from 25/1.66 is now 12/0.81 -Continue IV fluid hydration as above -Avoid nephrotoxic medications, contrast dyes, hypotension and renally dose medications -Repeat CMP in a.m.  Acute metabolic encephalopathy, multifactorial: fentanyl overdose, ativan, suspected basal ganglia infarct and suspected pneumonia -Checked ammonia level and went from 30 -> 47 -Plan as above; Neurology Following -Still remains Somnolent   Hypokalemia -Patient's K+ was 3.3 -Check Mag Level in the AM -Repelte with IV KCl 40 mEQ -Continue to Monitor and Replete as Necessary -Repeat CMP in the AM    DVT prophylaxis: Enoxaparin 40 mg sq q24h Code Status: FULL CODE  Family Communication: No family present at bedside  Disposition Plan: Pending further clinical improvement and clearance by Neurology; PT/OT/SLP recommending CIR  Status is: Inpatient  Remains inpatient appropriate because:Unsafe d/c plan, IV treatments appropriate due to intensity of illness or inability to take PO and Inpatient level of care appropriate due to severity of illness   Dispo: The patient is from: Home              Anticipated d/c is to: CIR              Patient currently is not medically stable to d/c.   Difficult to place patient No  Consultants:   Neurology    Procedures:  ECHOCARDIOGRAM Done and Pending Read  Antimicrobials:  Anti-infectives (From admission, onward)   Start     Dose/Rate Route Frequency Ordered Stop   05/29/20 1400  cefTRIAXone (ROCEPHIN) 2 g in sodium chloride 0.9 % 100 mL IVPB        2 g 200 mL/hr over 30 Minutes Intravenous Every 24 hours 05/29/20 1155 06/03/20 0959   05/29/20 1400  azithromycin (ZITHROMAX) 500 mg in sodium chloride 0.9 % 250 mL IVPB        500 mg 250 mL/hr over 60 Minutes Intravenous Every 24 hours 05/29/20 1155 06/03/20 0959   05/29/20 0815  Ampicillin-Sulbactam (UNASYN) 3 g in sodium chloride 0.9 % 100 mL IVPB         3 g 200 mL/hr over 30 Minutes Intravenous  Once 05/29/20 0804 05/29/20 0859        Subjective: Examined at bedside and he is somnolent and drowsy and wakes up briefly but then goes back to sleep.  Denies any complaints of pain.  No nausea or lightheadedness or dizziness.  No other concerns or complaints at this time but nursing states that he was very agitated earlier.   Objective: Vitals:   05/29/20 2318 05/30/20 0302 05/30/20 0752 05/30/20 1246  BP: 112/68 119/81 (!) 127/99 (!) 123/94  Pulse: (!) 107 (!) 109 91 95  Resp: Temp: 98.8 F (37.1 C) 98.5 F (36.9 C) 98.8 F (37.1 C) 98.1 F (36.7 C)  TempSrc: Oral Oral Axillary Axillary  SpO2: 97% 94% 99% 94%  Weight:  Intake/Output Summary (Last 24 hours) at 05/30/2020 1640 Last data filed at 05/30/2020 1602 Gross per 24 hour  Intake 1999.4 ml  Output 1700 ml  Net 299.4 ml   Filed Weights   05/29/20 1839  Weight: 79.2 kg   Examination: Physical Exam:  Constitutional: WN/WD Hispanic male in NAD and appears calm and comfortable Eyes: Lids and conjunctivae normal, sclerae anicteric  ENMT: External Ears, Nose appear normal. Grossly normal hearing. Neck: Appears normal, supple, no cervical masses, normal ROM, no appreciable thyromegaly; no JVD Respiratory: Diminished to auscultation bilaterally, no wheezing, rales, rhonchi or crackles. Normal respiratory effort and patient is not tachypenic. No accessory muscle use.  Unlabored breathing Cardiovascular: RRR, no murmurs / rubs / gallops. S1 and S2 auscultated. No extremity edema.  Abdomen: Soft, non-tender, distended secondary to body habitus.  Bowel sounds positive.  GU: Deferred. Musculoskeletal: No clubbing / cyanosis of digits/nails. No joint deformity upper and lower extremities.  Skin: No rashes, lesions, ulcers on limited skin evaluation. No induration; Warm and dry.  Neurologic: He is somnolent and drowsy and wakes up briefly but goes back to sleep and  does not want to participate in neuro examination Psychiatric: Mildly impaired judgment and insight.  He is somnolent and drowsy. Normal mood and appropriate affect.   Data Reviewed: I have personally reviewed following labs and imaging studies  CBC: Recent Labs  Lab 05/29/20 0815 05/30/20 0610  WBC 13.7* 12.7*  NEUTROABS 11.4*  --   HGB 15.5 13.7  HCT 47.6 42.5  MCV 90.8 90.8  PLT 206 167   Basic Metabolic Panel: Recent Labs  Lab 05/29/20 0815 05/30/20 0610  NA 143 138  K 3.9 3.3*  CL 110 107  CO2 22 25  GLUCOSE 118* 103*  BUN 25* 12  CREATININE 1.66* 0.81  CALCIUM 9.0 8.6*   GFR: CrCl cannot be calculated (Unknown ideal weight.). Liver Function Tests: Recent Labs  Lab 05/29/20 0815 05/30/20 0610  AST 387* 330*  ALT 372* 451*  ALKPHOS 84 58  BILITOT 0.8 0.6  PROT 8.3* 6.2*  ALBUMIN 4.6 3.3*   No results for input(s): LIPASE, AMYLASE in the last 168 hours. Recent Labs  Lab 05/29/20 1339 05/29/20 2115  AMMONIA 30 47*   Coagulation Profile: No results for input(s): INR, PROTIME in the last 168 hours. Cardiac Enzymes: Recent Labs  Lab 05/29/20 0815 05/29/20 2115 05/30/20 0610  CKTOTAL 1,410* 2,772* 2,189*   BNP (last 3 results) No results for input(s): PROBNP in the last 8760 hours. HbA1C: Recent Labs    05/29/20 2115  HGBA1C 6.0*   CBG: Recent Labs  Lab 05/29/20 0848  GLUCAP 113*   Lipid Profile: Recent Labs    05/29/20 2115  CHOL 132  HDL 45  LDLCALC 76  TRIG 54  CHOLHDL 2.9   Thyroid Function Tests: No results for input(s): TSH, T4TOTAL, FREET4, T3FREE, THYROIDAB in the last 72 hours. Anemia Panel: Recent Labs    05/29/20 2115  VITAMINB12 644   Sepsis Labs: No results for input(s): PROCALCITON, LATICACIDVEN in the last 168 hours.  Recent Results (from the past 240 hour(s))  Resp Panel by RT-PCR (Flu A&B, Covid) Nasopharyngeal Swab     Status: None   Collection Time: 05/29/20  8:15 AM   Specimen: Nasopharyngeal Swab;  Nasopharyngeal(NP) swabs in vial transport medium  Result Value Ref Range Status   SARS Coronavirus 2 by RT PCR NEGATIVE NEGATIVE Final    Comment: (NOTE) SARS-CoV-2 target nucleic acids are NOT DETECTED.  The SARS-CoV-2 RNA is generally detectable in upper respiratory specimens during the acute phase of infection. The lowest concentration of SARS-CoV-2 viral copies this assay can detect is 138 copies/mL. A negative result does not preclude SARS-Cov-2 infection and should not be used as the sole basis for treatment or other patient management decisions. A negative result may occur with  improper specimen collection/handling, submission of specimen other than nasopharyngeal swab, presence of viral mutation(s) within the areas targeted by this assay, and inadequate number of viral copies(<138 copies/mL). A negative result must be combined with clinical observations, patient history, and epidemiological information. The expected result is Negative.  Fact Sheet for Patients:  BloggerCourse.comhttps://www.fda.gov/media/152166/download  Fact Sheet for Healthcare Providers:  SeriousBroker.ithttps://www.fda.gov/media/152162/download  This test is no t yet approved or cleared by the [place] FDA and  has been authorized for detection and/or diagnosis of SARS-CoV-2 by FDA under an Emergency Use Authorization (EUA). This EUA will remain  in effect (meaning this test can be used) for the duration of the COVID-19 declaration under Section 564(b)(1) of the Act, 21 U.S.C.section 360bbb-3(b)(1), unless the authorization is terminated  or revoked sooner.       Influenza A by PCR NEGATIVE NEGATIVE Final   Influenza B by PCR NEGATIVE NEGATIVE Final    Comment: (NOTE) The Xpert Xpress SARS-CoV-2/FLU/RSV plus assay is intended as an aid in the diagnosis of influenza from Nasopharyngeal swab specimens and should not be used as a sole basis for treatment. Nasal washings and aspirates are unacceptable for Xpert Xpress  SARS-CoV-2/FLU/RSV testing.  Fact Sheet for Patients: BloggerCourse.comhttps://www.fda.gov/media/152166/download  Fact Sheet for Healthcare Providers: SeriousBroker.ithttps://www.fda.gov/media/152162/download  This test is not yet approved or cleared by the [place] FDA and has been authorized for detection and/or diagnosis of SARS-CoV-2 by FDA under an Emergency Use Authorization (EUA). This EUA will remain in effect (meaning this test can be used) for the duration of the COVID-19 declaration under Section 564(b)(1) of the Act, 21 U.S.C. section 360bbb-3(b)(1), unless the authorization is terminated or revoked.  Performed at [facility], [address] [place], [place] [id number]      RN Pressure Injury Documentation:     There is no height or weight on file to calculate BMI.  Malnutrition Type:   Malnutrition Characteristics:   Nutrition Interventions:    Radiology Studies: DG Cervical Spine 2 or 3 views  Result Date: 05/29/2020 CLINICAL DATA:  Unresponsive patient.  Screening for MRI. EXAM: CERVICAL SPINE - 2-3 VIEW COMPARISON:  None. FINDINGS: There is no evidence of cervical spine fracture or prevertebral soft tissue swelling. Alignment is normal. No other significant bone abnormalities are identified. No metallic or radiopaque foreign body. IMPRESSION: Negative cervical spine radiographs. Electronically Signed   By: Kennith CenterEric  Mansell M.D.   On: 05/29/2020 13:53   DG Pelvis 1-2 Views  Result Date: 05/29/2020 CLINICAL DATA:  Pre MRI.  Patient unresponsive. EXAM: PELVIS - 1-2 VIEW COMPARISON:  None. FINDINGS: Contrast noted in the ureters and bladder from a recent chest CT. The bony pelvis is intact. No radiopaque foreign bodies are identified. IMPRESSION: No radiopaque foreign bodies. Electronically Signed   By: Rudie MeyerP.  Gallerani M.D.   On: 05/29/2020 13:49   DG Abd 1 View  Result Date: 05/29/2020 CLINICAL DATA:  Pre MRI. Patient unresponsive. Evaluate for foreign body that may  preclude MRI. EXAM: ABDOMEN - 1 VIEW COMPARISON:  None. FINDINGS: Contrast noted in the renal collecting systems and bladder from a recent CT scan. The abdominal bowel gas  pattern is unremarkable. No radiopaque foreign bodies are identified. IMPRESSION: No radiopaque foreign bodies are identified. Electronically Signed   By: Rudie Meyer M.D.   On: 05/29/2020 13:49   CT Head Wo Contrast  Result Date: 05/29/2020 CLINICAL DATA:  Mental status change.  Found unresponsive. EXAM: CT HEAD WITHOUT CONTRAST TECHNIQUE: Contiguous axial images were obtained from the base of the skull through the vertex without intravenous contrast. COMPARISON:  None. FINDINGS: Brain: There is abnormal hypoattenuation involving the right lentiform nucleus and internal capsule. The left basal ganglia appear intact. No acute cortically based infarct, intracranial hemorrhage, mass/mass effect, or extra-axial fluid collection is identified. The ventricles are normal in size. A cavum septum pellucidum is incidentally noted. Vascular: No hyperdense vessel. Skull: No fracture or suspicious osseous lesion. Sinuses/Orbits: Mild mucosal thickening in the included paranasal sinuses. Clear mastoid air cells. Disc conjugate gaze. Other: None IMPRESSION: Low-density in the right basal ganglia suggestive of an acute infarct. Electronically Signed   By: Sebastian Ache M.D.   On: 05/29/2020 10:32   CT Angio Chest PE W and/or Wo Contrast  Result Date: 05/29/2020 CLINICAL DATA:  Unresponsive, hypoxic, high probability PE EXAM: CT ANGIOGRAPHY CHEST WITH CONTRAST TECHNIQUE: Multidetector CT imaging of the chest was performed using the standard protocol during bolus administration of intravenous contrast. Multiplanar CT image reconstructions and MIPs were obtained to evaluate the vascular anatomy. CONTRAST:  OMNIPAQUE IOHEXOL 350 MG/ML SOLN COMPARISON:  None. FINDINGS: Cardiovascular: Heart size normal. No pericardial effusion. Satisfactory  opacification of pulmonary arteries noted, and there is no evidence of pulmonary emboli. Motion degrades some of the images through the lung bases. Adequate contrast opacification of the thoracic aorta with no evidence of dissection, aneurysm, or stenosis. There is bovine variant brachiocephalic arch anatomy without proximal stenosis. No significant atheromatous change. Mediastinum/Nodes: No mass or adenopathy. Lungs/Pleura: No pleural effusion. No pneumothorax. Patchy ground-glass and airspace opacities in both lower lobes left greater than right. Scattered ground-glass alveolar opacities in the posterior left upper lobe. Upper Abdomen: No acute abnormality. Musculoskeletal: No chest wall abnormality. No acute or significant osseous findings. Review of the MIP images confirms the above findings. IMPRESSION: 1. Negative for acute PE or thoracic aortic dissection. 2. Patchy bibasilar and posterior left upper lobe infiltrates, nonspecific but possibly related to aspiration. Electronically Signed   By: Corlis Leak M.D.   On: 05/29/2020 10:17   MR BRAIN WO CONTRAST  Result Date: 05/29/2020 EXAM: MRI HEAD WITHOUT CONTRAST TECHNIQUE: Multiplanar, multiecho pulse sequences of the brain and surrounding structures were obtained without intravenous contrast. COMPARISON:  Same day CT head. FINDINGS: Brain: Acute 1.4 cm perforator infarct in the right putamen/globus pallidus (series 5, image 71) with additional punctate acute infarct in the adjacent anterior limb of the right internal capsule (series 5, image 75). Acute 0.6 cm perforator infarct in the left globus pallidus. Amorphous acute infarct in the left posterior parietal white matter (series 5, image 73). Associated edema without significant mass effect. No acute hemorrhage. No hydrocephalus. Cavum septum pellucidum et vergae, anatomic variant. No mass lesion. No midline shift. Basal cisterns are patent. No extra-axial fluid collection. Vascular: Major arterial flow  voids are maintained at the skull base. Skull and upper cervical spine: Diffuse T1 hypointensity of the visualized cervical bone marrow. No focal marrow replacing lesion. Sinuses/Orbits: Mild mucosal thickening of scattered ethmoid air cells, bilateral sphenoid sinuses and maxillary sinuses with bilateral sphenoid sinus air-fluid levels and frothy secretions. Other: No mastoid effusions. IMPRESSION: 1. Acute perforator  infarcts in bilateral basal ganglia and acute left parietal white matter infarct, as detailed above. Associated edema without significant mass effect. Given involvement of multiple vascular territories, consider embolic etiology. 2. Mild paranasal sinus mucosal thickening with bilateral sphenoid sinus air-fluid levels. 3. Diffuse T1 hypointensity of the visualized cervical bone marrow, which is nonspecific but could be related to chronic anemia, chronic hypoxia (such as in smokers), or less likely a lymphoproliferative disorder. Electronically Signed   By: Feliberto Harts MD   On: 05/29/2020 15:10   DG Chest Portable 1 View  Result Date: 05/29/2020 CLINICAL DATA:  evaluate for aspiration PNA, suspected opiod overdose, hypoxic EXAM: PORTABLE CHEST - 1 VIEW COMPARISON:  None available. FINDINGS: The mediastinal contours are within normal limits. No cardiomegaly. Right lower lobe and retrocardiac faint scattered opacities. No significant pleural effusion or evidence of pneumothorax. No acute osseous abnormality. IMPRESSION: Right lower lobe and retrocardiac faint scattered opacities which are nonspecific but could represent sequela of aspiration, multifocal pneumonia, or subsegmental atelectasis. Electronically Signed   By: Marliss Coots MD   On: 05/29/2020 08:35   US Abdomen Limited RUQ (LIVER/GB)  Result Date: 05/29/2020 CLINICAL DATA:  Abnormal LFTs. EXAM: ULTRASOUND ABDOMEN LIMITED RIGHT UPPER QUADRANT COMPARISON:  No prior. FINDINGS: Gallbladder: Exam limited as patient was nonresponsive.  Also prominent amount of overlying bowel gas limited the exam. No gallstones or wall thickening visualized. No sonographic Murphy sign noted by sonographer. Common bile duct: Diameter: Not visualized Liver: Increased echogenicity consistent with fatty infiltration and or hepatocellular disease. Decreased attenuation liver adjacent to the gallbladder fossa consistent with focal fatty sparing. Portal vein is patent on color Doppler imaging with normal direction of blood flow towards the liver. Other: Mild ascites. IMPRESSION: Limited exam as above. Diffuse increased hepatic echogenicity consistent fatty infiltration or hepatocellular disease. Probable focal fatty sparing adjacent to the gallbladder fossa. 2.  No gallstones.  Common bile duct not visualized. 3.  Mild ascites. Electronically Signed   By: Maisie Fus  Register   On: 05/29/2020 13:36   Scheduled Meds: .  stroke: mapping our early stages of recovery book   Does not apply Once  . enoxaparin (LOVENOX) injection  40 mg Subcutaneous Q24H  . mouth rinse  15 mL Mouth Rinse BID  . [START ON 06/02/2020] thiamine injection  100 mg Intravenous Daily   Continuous Infusions: . sodium chloride 200 mL/hr at 05/30/20 1330  . azithromycin 500 mg (05/30/20 1009)  . cefTRIAXone (ROCEPHIN)  IV 2 g (05/30/20 0845)  . thiamine injection 500 mg (05/30/20 1331)    LOS: 1 day   Merlene Laughter, DO [facility] PAGER is on AMION  If 7PM-7AM, please contact night-coverage [email or web address]

## 2020-05-30 NOTE — Progress Notes (Addendum)
STROKE TEAM PROGRESS NOTE   INTERVAL HISTORY No acute events   Vitals:   05/29/20 2318 05/30/20 0302 05/30/20 0752 05/30/20 1246  BP: 112/68 119/81 (!) 127/99 (!) 123/94  Pulse: (!) 107 (!) 109 91 95  Resp: 15 20 15 17   Temp: 98.8 F (37.1 C) 98.5 F (36.9 C) 98.8 F (37.1 C) 98.1 F (36.7 C)  TempSrc: Oral Oral Axillary Axillary  SpO2: 97% 94% 99% 94%  Weight:       CBC:  Recent Labs  Lab 05/29/20 0815 05/30/20 0610  WBC 13.7* 12.7*  NEUTROABS 11.4*  --   HGB 15.5 13.7  HCT 47.6 42.5  MCV 90.8 90.8  PLT 206 167   Basic Metabolic Panel:  Recent Labs  Lab 05/29/20 0815 05/30/20 0610  NA 143 138  K 3.9 3.3*  CL 110 107  CO2 22 25  GLUCOSE 118* 103*  BUN 25* 12  CREATININE 1.66* 0.81  CALCIUM 9.0 8.6*   Lipid Panel:  Recent Labs  Lab 05/29/20 2115  CHOL 132  TRIG 54  HDL 45  CHOLHDL 2.9  VLDL 11  LDLCALC 76   HgbA1c:  Recent Labs  Lab 05/29/20 2115  HGBA1C 6.0*   Urine Drug Screen:  Recent Labs  Lab 05/29/20 1209  LABOPIA NONE DETECTED  COCAINSCRNUR NONE DETECTED  LABBENZ POSITIVE*  AMPHETMU POSITIVE*  THCU POSITIVE*  LABBARB NONE DETECTED    Alcohol Level  Recent Labs  Lab 05/29/20 0815  ETH <10   IMAGING past 24 hours ECHOCARDIOGRAM COMPLETE  Result Date: 05/30/2020    ECHOCARDIOGRAM REPORT   Patient Name:   Garrett West Date of Exam: 05/30/2020 Medical Rec #:  [id number]             Height: Accession #:    [id number]            Weight:       174.6 lb Date of Birth:  01/14/1995             BSA:          1.845 m Patient Age:    24 years              BP:           123/94 mmHg Patient Gender: M                     HR:           95 bpm. Exam Location:  Inpatient Procedure: 2D Echo, Cardiac Doppler, Limited Color Doppler and Intracardiac            Opacification Agent Indications:    Elevated troponin                 Stroke  History:        Patient has no prior history of Echocardiogram examinations.                 Elevated  troponin. Stroke. Polysubstance abuse.  Sonographer:    Ross LudwigArthur Guy RDCS (AE) Referring Phys: [id number] JARED E SEGAL IMPRESSIONS  1. Left ventricular ejection fraction, by estimation, is 55 to 60%. The left ventricle has normal function. The left ventricle has no regional wall motion abnormalities. Left ventricular diastolic parameters were normal.  2. Right ventricular systolic function is normal. The right ventricular size is normal.  3. The mitral valve is normal in structure. Trivial mitral valve regurgitation. No evidence of mitral stenosis.  4.  The aortic valve is tricuspid. Aortic valve regurgitation is not visualized. No aortic stenosis is present.  5. The inferior vena cava is normal in size with greater than 50% respiratory variability, suggesting right atrial pressure of 3 mmHg. Comparison(s): No prior Echocardiogram. Conclusion(s)/Recommendation(s): No intracardiac source of embolism detected on this transthoracic study. A transesophageal echocardiogram is recommended to exclude cardiac source of embolism if clinically indicated. FINDINGS  Left Ventricle: Left ventricular ejection fraction, by estimation, is 55 to 60%. The left ventricle has normal function. The left ventricle has no regional wall motion abnormalities. Definity contrast agent was given IV to delineate the left ventricular  endocardial borders. The left ventricular internal cavity size was normal in size. There is no left ventricular hypertrophy. Left ventricular diastolic parameters were normal. Right Ventricle: The right ventricular size is normal. No increase in right ventricular wall thickness. Right ventricular systolic function is normal. Left Atrium: Left atrial size was normal in size. Right Atrium: Right atrial size was normal in size. Pericardium: There is no evidence of pericardial effusion. Mitral Valve: The mitral valve is normal in structure. Trivial mitral valve regurgitation. No evidence of mitral valve stenosis. Tricuspid  Valve: The tricuspid valve is normal in structure. Tricuspid valve regurgitation is trivial. Aortic Valve: The aortic valve is tricuspid. Aortic valve regurgitation is not visualized. No aortic stenosis is present. Aortic valve mean gradient measures 3.0 mmHg. Aortic valve peak gradient measures 6.0 mmHg. Aortic valve area, by VTI measures 2.49 cm. Pulmonic Valve: The pulmonic valve was normal in structure. Pulmonic valve regurgitation is trivial. Aorta: The aortic root and ascending aorta are structurally normal, with no evidence of dilitation. Venous: The inferior vena cava is normal in size with greater than 50% respiratory variability, suggesting right atrial pressure of 3 mmHg. IAS/Shunts: No atrial level shunt detected by color flow Doppler.  LEFT VENTRICLE PLAX 2D LVIDd:         5.00 cm  Diastology LVIDs:         3.80 cm  LV e' medial:  7.83 cm/s LV PW:         1.30 cm  LV e' lateral: 8.27 cm/s LV IVS:        1.10 cm LVOT diam:     2.00 cm LV SV:         48 LV SV Index:   26 LVOT Area:     3.14 cm  RIGHT VENTRICLE            IVC RV Basal diam:  3.10 cm    IVC diam: 2.00 cm RV S prime:     9.03 cm/s TAPSE (M-mode): 1.7 cm LEFT ATRIUM             Index       RIGHT ATRIUM           Index LA diam:        3.20 cm 1.73 cm/m  RA Area:     12.40 cm LA Vol (A2C):   46.0 ml 24.94 ml/m RA Volume:   28.00 ml  15.18 ml/m LA Vol (A4C):   45.5 ml 24.67 ml/m LA Biplane Vol: 48.4 ml 26.24 ml/m  AORTIC VALVE AV Area (Vmax):    2.45 cm AV Area (Vmean):   2.27 cm AV Area (VTI):     2.49 cm AV Vmax:           122.00 cm/s AV Vmean:          83.700 cm/s AV VTI:  0.193 m AV Peak Grad:      6.0 mmHg AV Mean Grad:      3.0 mmHg LVOT Vmax:         95.30 cm/s LVOT Vmean:        60.600 cm/s LVOT VTI:          0.153 m LVOT/AV VTI ratio: 0.79  AORTA Ao Root diam: 3.40 cm Ao Asc diam:  2.80 cm  SHUNTS Systemic VTI:  0.15 m Systemic Diam: 2.00 cm Laurance Flatten MD Electronically signed by Laurance Flatten MD  Signature Date/Time: 05/30/2020/5:14:50 PM    Final    PHYSICAL EXAM Constitutional: Appears well-developed and well-nourished, muscular.  Psych: Affect appropriate to situation, anxious but cooperative Eyes: No scleral injection HENT: No oropharyngeal obstruction.  MSK: no joint deformities.  Cardiovascular: Normal rate and regular rhythm.  Respiratory: Effort normal, non-labored breathing GI: Soft.  No distension. There is no tenderness.  Skin: Warm to the touch but afebrile dry and intact visible skin, scattered tattoos  Neuro: Mental Status: Patient is very hard to awaken but after repeated noxious stimulation maintains alertness for some time.  He is oriented to person, place, not month (reports it's June), year, and does not know the details of what happened No signs of aphasia or neglect Cranial Nerves: II: Visual Fields are full. Pupils are equal, round, and reactive to light.  2.5 to 1 mm III,IV, VI: EOMI  V: Facial sensation is symmetric to eyelash brush VII: Facial movement is symmetric.  VIII: hearing is intact to voice XII: tongue is midline without atrophy or fasciculations.  Motor: Tone is normal. Bulk is normal.  He is noticeably weaker in the left upper extremity and slightly weaker in the left lower extremity, he frequently uses the right upper extremity to assist movement of the left upper extremity when asked to perform commands such as finger-to-nose testing.  He has some mild asterixis Sensory: Sensation is symmetric to light touch in the arms and legs. Deep Tendon Reflexes: 2+ and symmetric in the biceps, could not elicit patellae. Plantars: Toes are mute bilaterally. Cerebellar: His coordination is fairly compromised bilaterally in the upper extremities, left worse than right in the upper extremities, toe to hand is intact bilaterally  ASSESSMENT/PLAN Garrett West is a 25 y.o. male with history of polysubstance abuse who was found unresponsive  with O2 sat in 70s in a parking lot 5/19 where he given intranasal narcan by fire rescue and transported to ED. Upon arrival to ED patient was confused, agitated and combative but did not require intubation. CTA of the chest was negative for PE but did show patchy bibasilar and posterior left upper lobe infiltrates likely related to aspiration.  Patient had a head CT which showed low-density in right basal ganglia suggestive of an acute infarct.  Noted elevation of troponin was felt to be related to demand ischemia.   Stroke Multifocal strokes in the setting of polysubstance use, etiology unclear, concerning for RCVS in the setting of drug use vs. endocarditis with fever, leukocytosis and multiorgan involvement.   Code Stroke CT head:   Low-density in the right basal ganglia suggestive of an acute infarct.  MRI: . Acute perforator infarcts in bilateral basal ganglia and acute left parietal white matter infarct. Associated edema without significant mass effect.   MRA head and neck pending  2D Echo EF 55-60%. No thrombus, wall motion abnormality, significant valve abnormality or shunt found.  TEE pending next week  LDL 76  HgbA1c 6.0  VTE prophylaxis - On lovenox PPX  No AC/AP prior to admission, No antiplatelet therapy for now, need to rule out endocarditis and mycotic aneurysm   Therapy recommendations:  TBD  Disposition:  TBD  Multifactorial Encephalopathy  May be stroke related considering location of infarcts  Polysubstance withdrawal likely contributing  Acute infectionn: Aspiration PNA,   Elevated ammonia (mild) at 47  B12 level 644, B1 level pending  HIV negative  RPR nonreactive  Delirium precautions  Aspiration precautions   Aspiration PNA  Found unresponsive with O2 sats 70s likely aspiration event. Covid and influenza panels negative.   CTA chest with patchy bibasilar and posterior LUL infiltrates with leukocytosis and tmax 100.8 today.   On Empiric  Ceftriaxone and azithromycin for now per primary team   Aspiration precautions  BP management  No hx of HTN, no noted hypotension thus far   Stable . Permissive hypertension (OK if < 220/120) but gradually normalize in 5-7 days, avoid hypotension . Long-term BP goal normotensive  Hyperlipidemia  Home meds: none  LDL 76, not quite at goal < 70  Not statin for now given elevated LFTs and CK  Consider statin once LFTs and CK normalized  Polysubstance abuse  positive for amphetamines (admits to daily crystal meth use, benzodiazepines (street xanax)  and THC, with reported fentanyl sniffing  On CIWA, MVI and thiamine per primary team   Other Stroke Risk Factors  Polysubstance abuse: positive for amphetamines (admits to daily crystal meth use, benzodiazepines (street xanax)  and THC, with reported fentanyl sniffing  Family hx stroke unknown  Hospital day # 1 This plan of care was directed by Dr. Everardo Pacific, NP-C   ATTENDING NOTE: I reviewed above note and agree with the assessment and plan. Pt was seen and examined.   25 year old male with substance abuse admitted for altered mental status and headache.  CT head showed right BG infarct.  MRI also showed right BG, left BG, left parieto-occipital white matter and right semiovale multifocal infarcts.  MRA head and neck pending.  EF 55 to 60%, no vegetation.  A1c 6.0, LDL 76.  UDS positive for benzo, amphetamine and THC.  Patient also initially reported fentanyl use but later denied.  Creatinine 0.81.  HIV and RPR negative.  Patient labs also indicate AST/ALT 330/451, elevated CK and troponin level.  On examination, patient sleepy but also mildly agitated on awakening, however, orientated x3, no aphasia, able to name and repeat, follow simple commands.  Visual fields full, no gaze palsy, facial symmetrical.  Left upper extremity proximal 3 -/5, bicep 3+/5, tricep and finger grip 5/5.  Right upper extremity and  bilateral lower extremity 5/5.  Sensation symmetrical, right finger-to-nose intact, left finger-to-nose ataxia but proportional to weakness.  Etiology for patient stroke not quite clear, concerning for RCVS in the setting of drug use versus endocarditis with fever, leukocytosis and multiorgan involvement.  Blood culture pending.  Recommend TEE to further rule out endocarditis.  Pending MRA head and neck to rule out vasospasm or mycotic aneurysm.  Hold off antiplatelet at this time until MRA head and neck done.  Hold off statin given elevated LFT and CK level.  Case discussed with Dr. Marland Mcalpine.  Continue antibiotics and IV fluid.  We will follow  For detailed assessment and plan, please refer to above as I have made changes wherever appropriate.   Marvel Plan, MD PhD Stroke Neurology 05/30/2020 7:19 PM  Patient has developed fever, leukocytosis, multiorgan involvement,  and I discussed with Dr. Marland Mcalpine. I spent  35 minutes in total face-to-face time with the patient, more than 50% of which was spent in counseling and coordination of care, reviewing test results, images and medication, and discussing the diagnosis, treatment plan and potential prognosis. This patient's care requiresreview of multiple databases, neurological assessment, discussion with family, other specialists and medical decision making of high complexity.   To contact Stroke Continuity provider, please refer to [email or web address]. After hours, contact General Neurology

## 2020-05-30 NOTE — Plan of Care (Signed)
  Problem: Education: Goal: Knowledge of disease or condition will improve Outcome: Progressing Goal: Knowledge of secondary prevention will improve Outcome: Progressing Goal: Knowledge of patient specific risk factors addressed and post discharge goals established will improve Outcome: Progressing   

## 2020-05-30 NOTE — Progress Notes (Signed)
   05/29/20 1839  Assess: MEWS Score  Temp 98.3 F (36.8 C)  BP 128/86  Pulse Rate (!) 105  ECG Heart Rate (!) 105  Resp 15  SpO2 95 %  Assess: MEWS Score  MEWS Temp 0  MEWS Systolic 0  MEWS Pulse 1  MEWS RR 0  MEWS LOC 2  MEWS Score 3  MEWS Score Color Yellow  Assess: if the MEWS score is Yellow or Red  Were vital signs taken at a resting state? Yes  Focused Assessment No change from prior assessment  Early Detection of Sepsis Score *See Row Information* Low  MEWS guidelines implemented *See Row Information* Yes  Treat  MEWS Interventions Other (Comment)  Pain Scale 0-10  Pain Score 0  Take Vital Signs  Increase Vital Sign Frequency  Yellow: Q 2hr X 2 then Q 4hr X 2, if remains yellow, continue Q 4hrs  Escalate  MEWS: Escalate Yellow: discuss with charge nurse/RN and consider discussing with provider and RRT  Notify: Charge Nurse/RN  Name of Charge Nurse/RN Notified Erin RN  Date Charge Nurse/RN Notified 05/29/20  Time Charge Nurse/RN Notified 1845  Document  Patient Outcome Stabilized after interventions  Progress note created (see row info) Yes

## 2020-05-31 ENCOUNTER — Inpatient Hospital Stay (HOSPITAL_COMMUNITY): Payer: Self-pay

## 2020-05-31 DIAGNOSIS — F1910 Other psychoactive substance abuse, uncomplicated: Secondary | ICD-10-CM

## 2020-05-31 DIAGNOSIS — T50901A Poisoning by unspecified drugs, medicaments and biological substances, accidental (unintentional), initial encounter: Secondary | ICD-10-CM

## 2020-05-31 LAB — CK: Total CK: 953 U/L — ABNORMAL HIGH (ref 49–397)

## 2020-05-31 LAB — CBC
HCT: 38 % — ABNORMAL LOW (ref 39.0–52.0)
Hemoglobin: 12.9 g/dL — ABNORMAL LOW (ref 13.0–17.0)
MCH: 29.7 pg (ref 26.0–34.0)
MCHC: 33.9 g/dL (ref 30.0–36.0)
MCV: 87.4 fL (ref 80.0–100.0)
Platelets: UNDETERMINED 10*3/uL (ref 150–400)
RBC: 4.35 MIL/uL (ref 4.22–5.81)
RDW: 12.6 % (ref 11.5–15.5)
WBC: 9 10*3/uL (ref 4.0–10.5)
nRBC: 0 % (ref 0.0–0.2)

## 2020-05-31 LAB — COMPREHENSIVE METABOLIC PANEL
ALT: 344 U/L — ABNORMAL HIGH (ref 0–44)
AST: 145 U/L — ABNORMAL HIGH (ref 15–41)
Albumin: 3.1 g/dL — ABNORMAL LOW (ref 3.5–5.0)
Alkaline Phosphatase: 55 U/L (ref 38–126)
Anion gap: 6 (ref 5–15)
BUN: 6 mg/dL (ref 6–20)
CO2: 23 mmol/L (ref 22–32)
Calcium: 8.7 mg/dL — ABNORMAL LOW (ref 8.9–10.3)
Chloride: 109 mmol/L (ref 98–111)
Creatinine, Ser: 0.82 mg/dL (ref 0.61–1.24)
GFR, Estimated: >60 mL/min (ref >60)
Glucose, Bld: 107 mg/dL — ABNORMAL HIGH (ref 70–99)
Potassium: 4.6 mmol/L (ref 3.5–5.1)
Sodium: 138 mmol/L (ref 135–145)
Total Bilirubin: 1.4 mg/dL — ABNORMAL HIGH (ref 0.3–1.2)
Total Protein: 5.7 g/dL — ABNORMAL LOW (ref 6.5–8.1)

## 2020-05-31 IMAGING — MR MR MRA NECK W/O CM
1 of 2 series · 39 of 48 positions shown · non-contrast
Comparison: Brain MRI [DATE].

CLINICAL DATA: 24-year-old male found unresponsive in grocery store
parking lot. Brain MRI reveals asymmetric abnormal diffusion in both
globus pallidus, but also the right caudate/corona radiata and left
periatrial white matter.

EXAM:
MRA HEAD WITHOUT CONTRAST
MRA NECK WITHOUT CONTRAST
TECHNIQUE: Angiographic images of the Circle of Willis were obtained using MRA
technique without intravenous contrast. Angiographic images of the
neck were obtained using MRA technique without intravenous contrast.
Carotid stenosis measurements (when applicable) are obtained
utilizing NASCET criteria, using the distal internal carotid
diameter as the denominator.

[Series 7: [id number] · axial · B · 0.6mm · 0.52mm/px · z∈[-50,+25]mm · 39 of 133 slices shown]
[im 1/133]
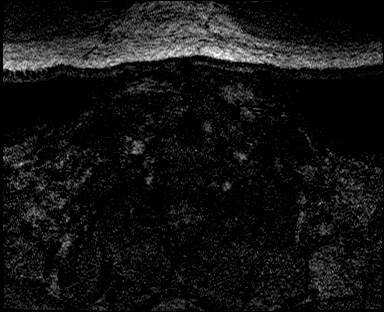
[im 4/133]
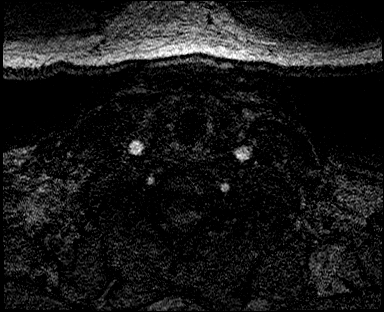
[im 7/133]
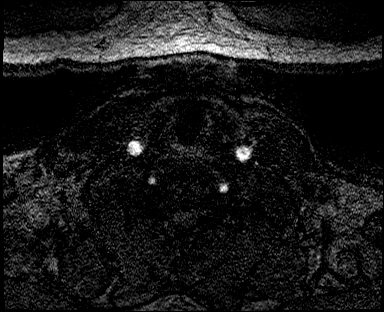
[im 10/133]
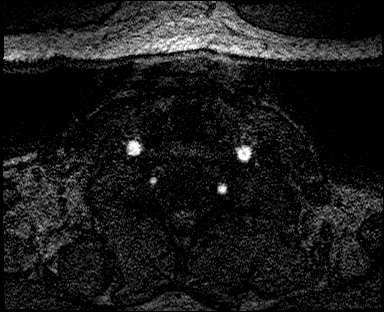
[im 13/133]
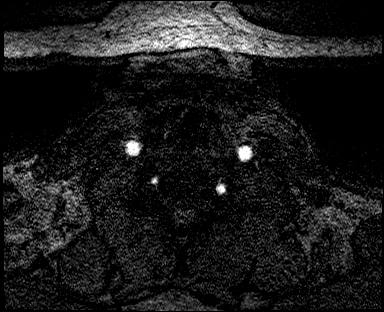
[im 17/133]
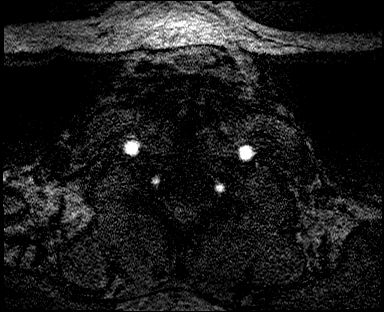
[im 20/133]
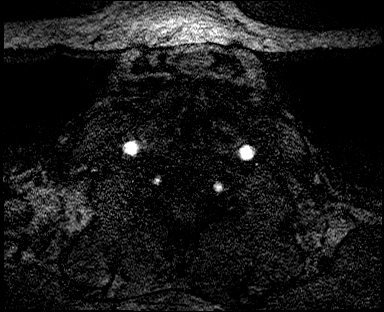
[im 23/133]
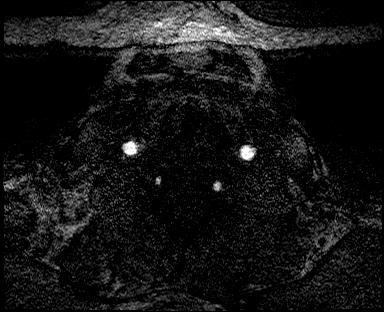
[im 26/133]
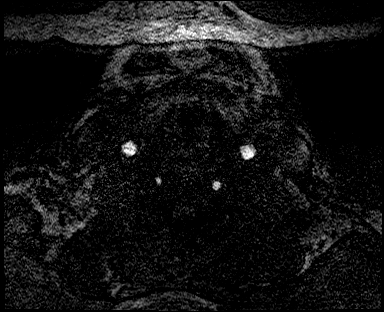
[im 29/133]
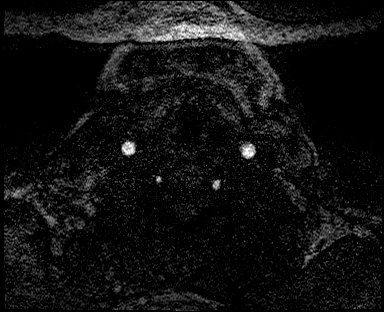
[im 33/133]
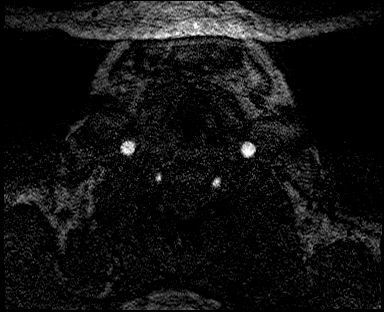
[im 36/133]
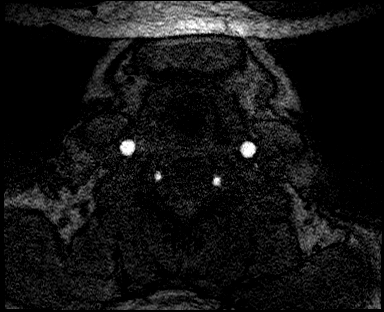
[im 39/133]
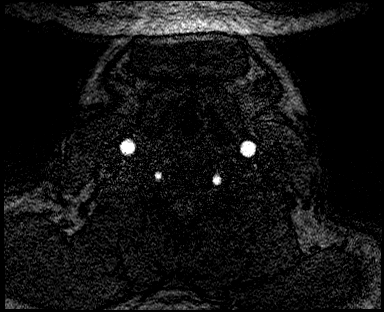
[im 42/133]
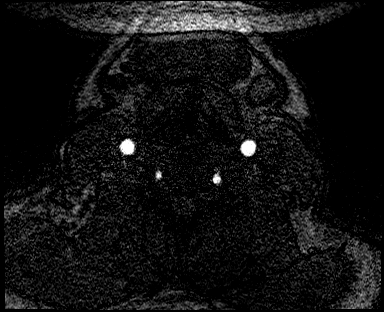
[im 46/133]
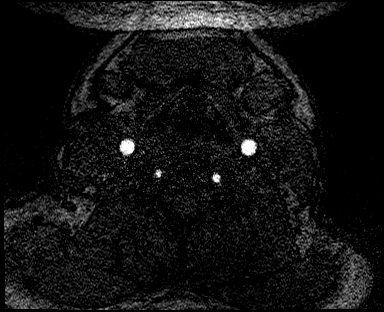
[im 49/133]
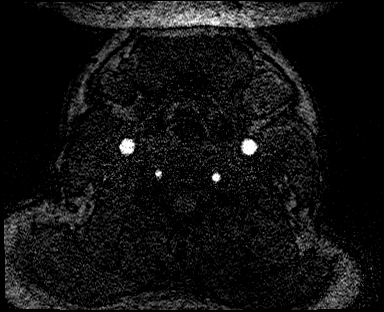
[im 52/133]
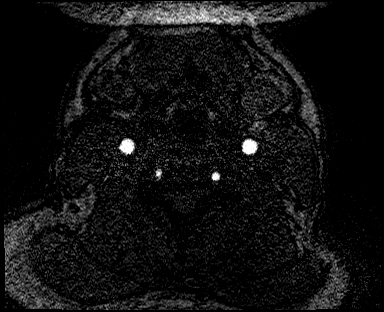
[im 55/133]
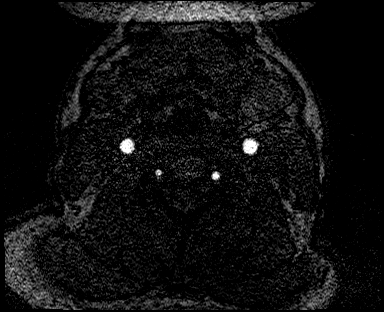
[im 58/133]
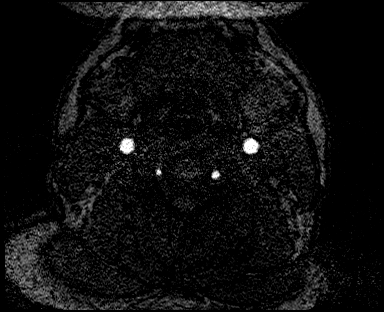
[im 62/133]
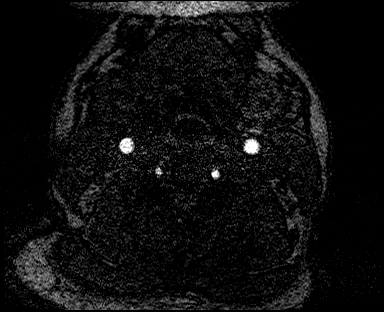
[im 65/133]
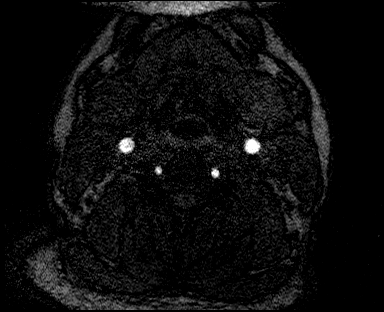
[im 68/133]
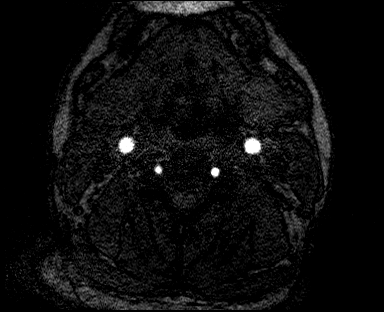
[im 71/133]
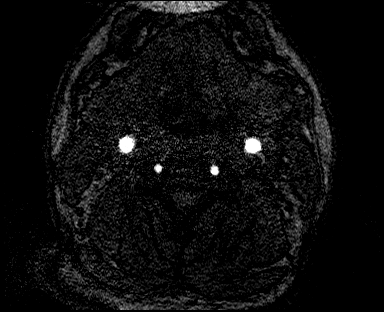
[im 75/133]
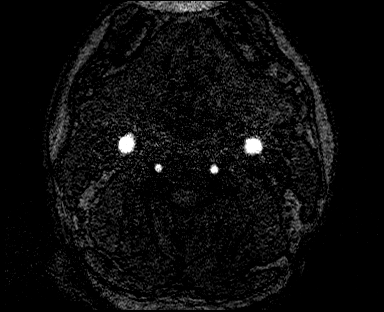
[im 78/133]
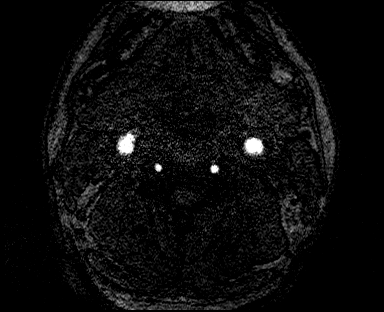
[im 81/133]
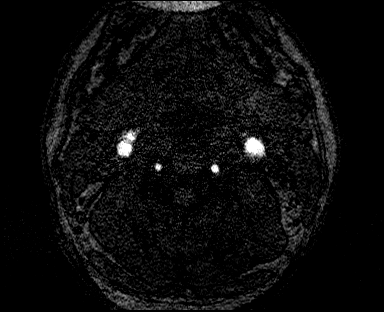
[im 84/133]
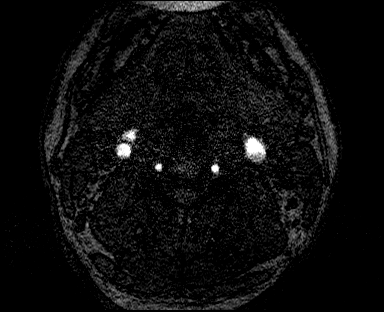
[im 87/133]
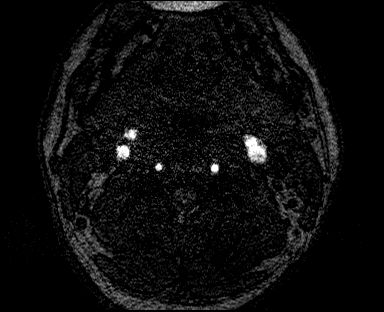
[im 91/133]
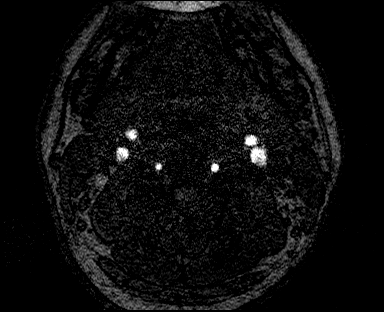
[im 94/133]
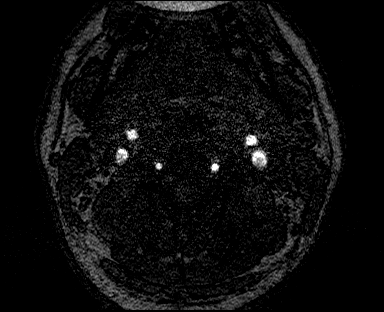
[im 97/133]
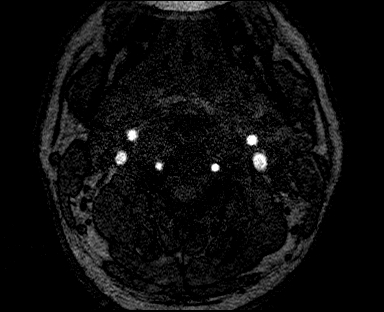
[im 100/133]
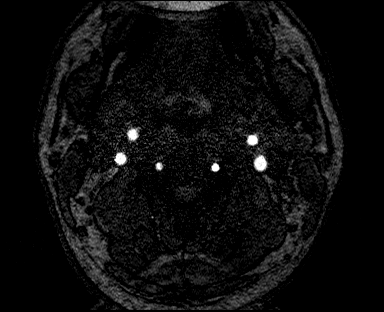
[im 104/133]
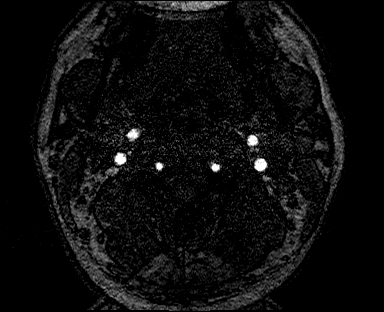
[im 107/133]
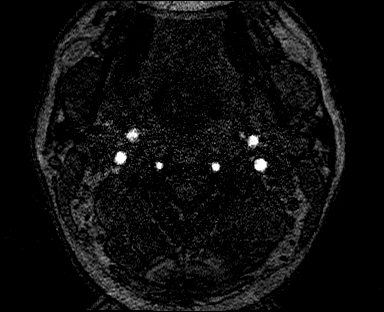
[im 110/133]
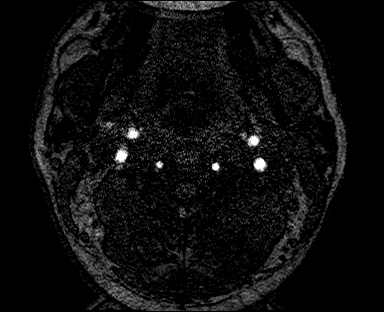
[im 113/133]
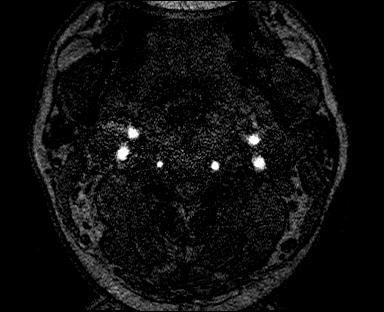
[im 116/133]
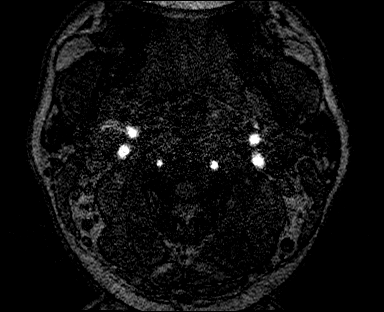
[im 120/133]
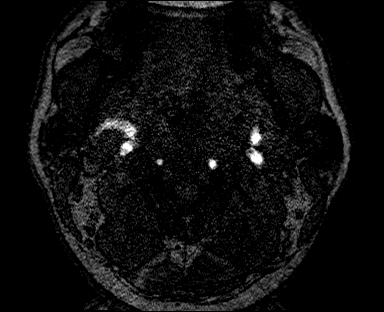
[im 126/133]
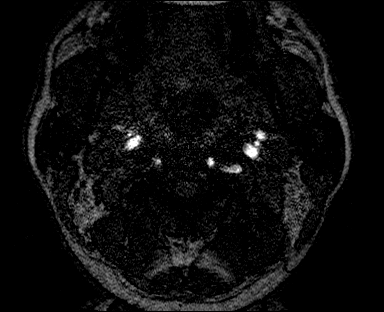

[39 of 48 positions shown; findings below may reference images not displayed]

FINDINGS: MRA NECK FINDINGS

Time-of-flight neck MRA demonstrates antegrade flow in both cervical
carotid and vertebral arteries. Relatively codominant vertebrals.
Both carotid bifurcations appear normal. Antegrade flow signal
continues to the skull base. No stenosis or irregularity is evident.

MRA HEAD FINDINGS

Incidental cavum septum pellucidum. No ventriculomegaly or
intracranial mass effect is evident.

Antegrade flow in the posterior circulation with slightly dominant
left vertebral V4 segment. Normal left PICA origin. Normal
vertebrobasilar junction. Dominant appearing right AICA is patent.
Patent basilar artery without stenosis. Normal SCA and left PCA
origins. Fetal type right PCA origin. Left posterior communicating
artery diminutive or absent. Bilateral PCA branches are within
normal limits.

Antegrade flow in both ICA siphons. No siphon stenosis. Normal
ophthalmic and right posterior communicating artery origins. Patent
carotid termini. Normal MCA and ACA origins. Anterior communicating
artery and visible ACA branches are within normal limits. Right MCA
M1, right MCA bifurcation and visible right MCA branches are within
normal limits. Left MCA bifurcates just off the terminus without
stenosis. Left MCA branches are within normal limits.
IMPRESSION: Negative MRA Head and Neck.

## 2020-05-31 IMAGING — MR MR MRA HEAD W/O CM
1 series · 19 of 48 positions shown · non-contrast
Comparison: Brain MRI [DATE].

CLINICAL DATA: 24-year-old male found unresponsive in grocery store
parking lot. Brain MRI reveals asymmetric abnormal diffusion in both
globus pallidus, but also the right caudate/corona radiata and left
periatrial white matter.

EXAM:
MRA HEAD WITHOUT CONTRAST
MRA NECK WITHOUT CONTRAST
TECHNIQUE: Angiographic images of the Circle of Willis were obtained using MRA
technique without intravenous contrast. Angiographic images of the
neck were obtained using MRA technique without intravenous contrast.
Carotid stenosis measurements (when applicable) are obtained
utilizing NASCET criteria, using the distal internal carotid
diameter as the denominator.

[Series 5: 3d cow · axial · 0.5mm · 0.41mm/px · z∈[-75,+6]mm · 19 of 172 slices shown]
[im 1/172]
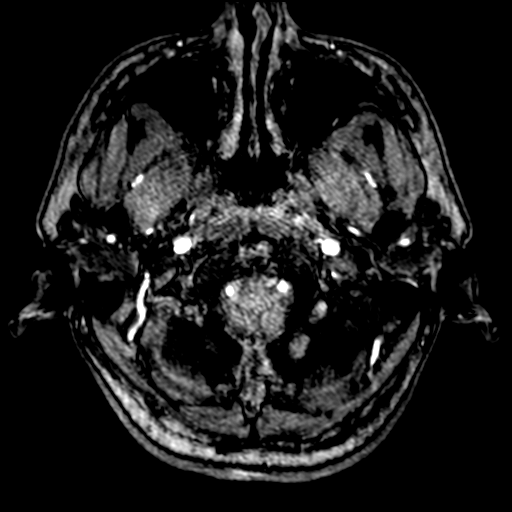
[im 4/172]
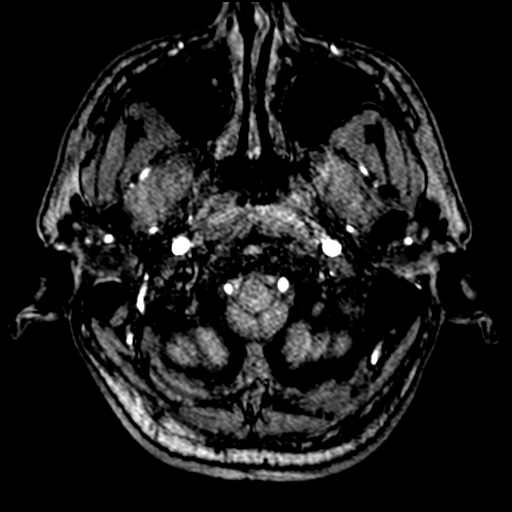
[im 8/172]
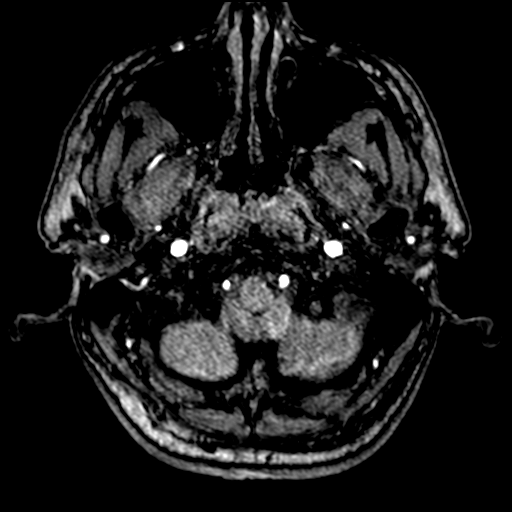
[im 11/172]
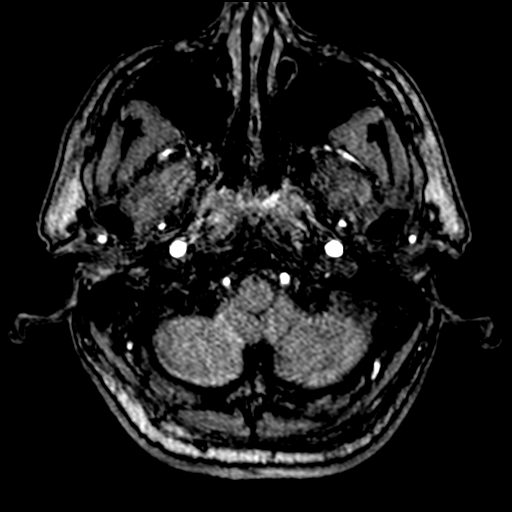
[im 15/172]
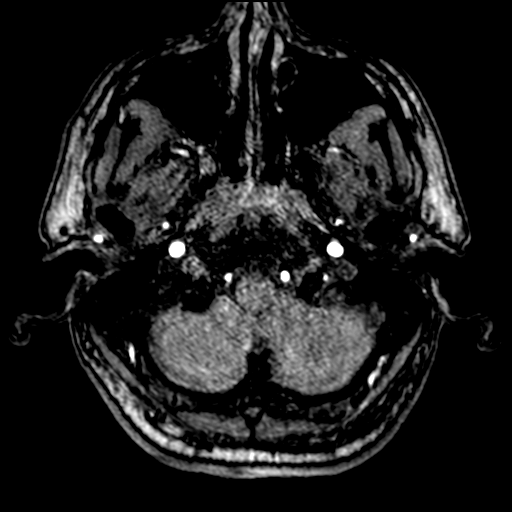
[im 19/172]
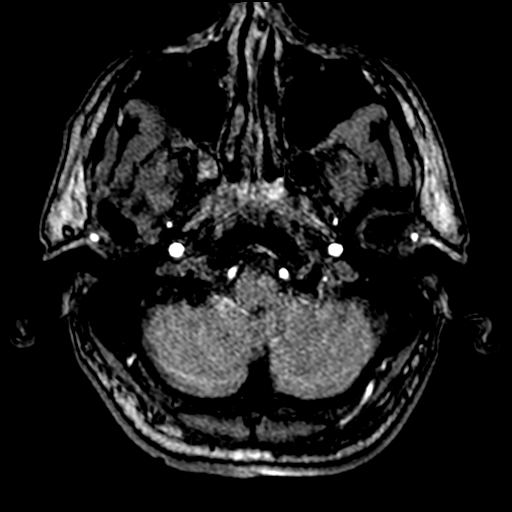
[im 22/172]
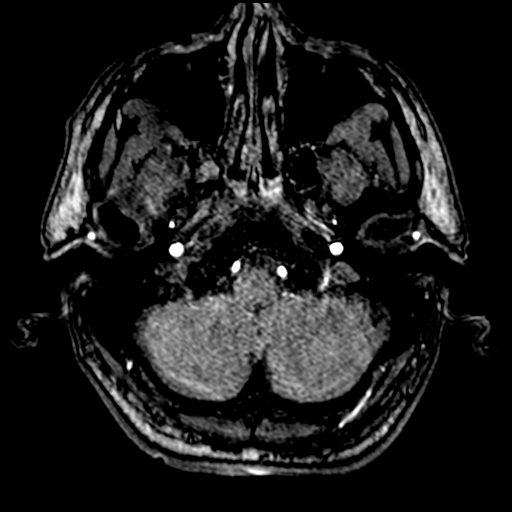
[im 26/172]
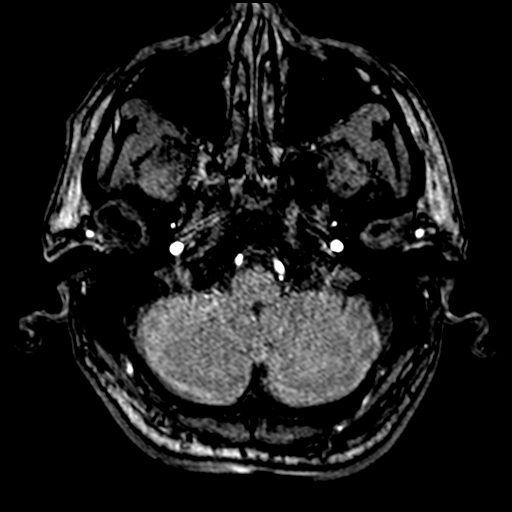
[im 30/172]
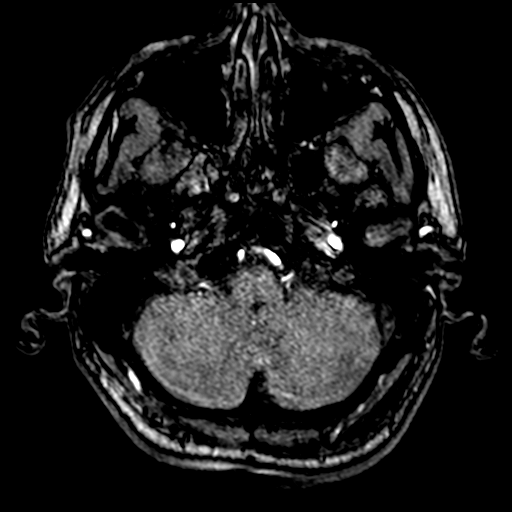
[im 33/172]
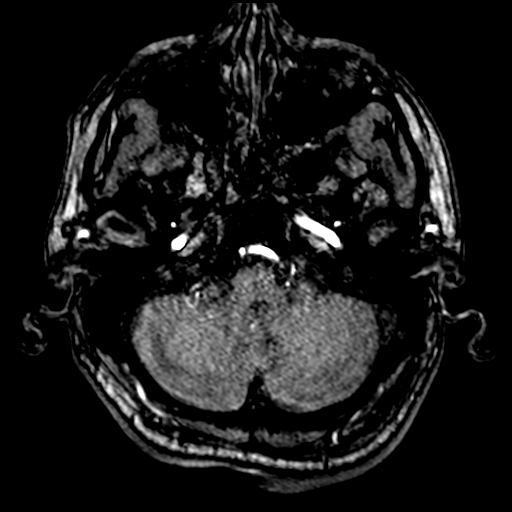
[im 37/172]
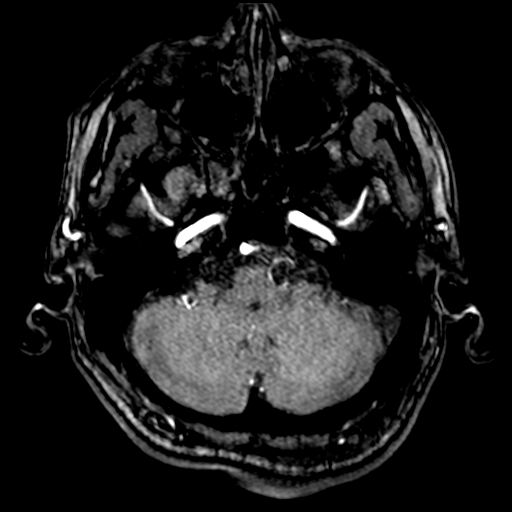
[im 55/172]
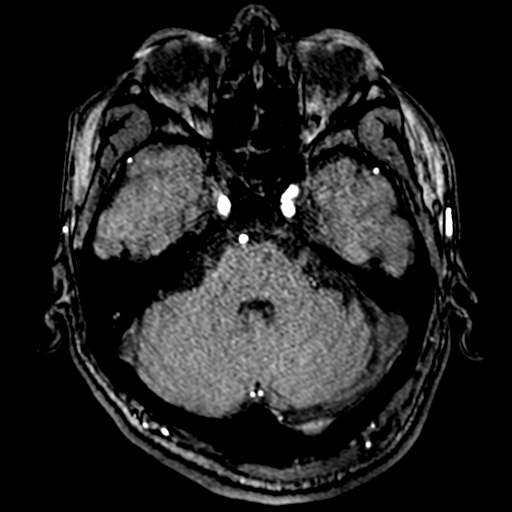
[im 77/172]
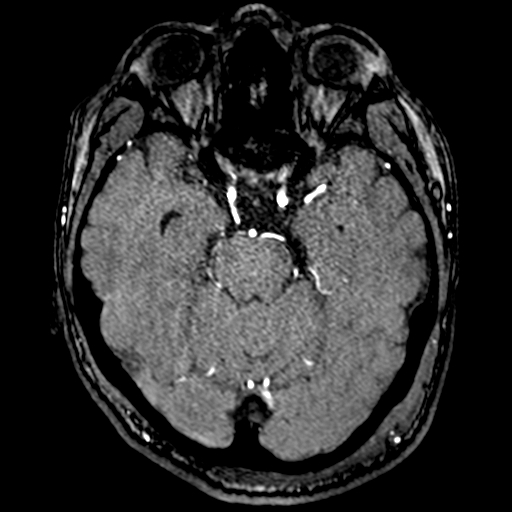
[im 88/172]
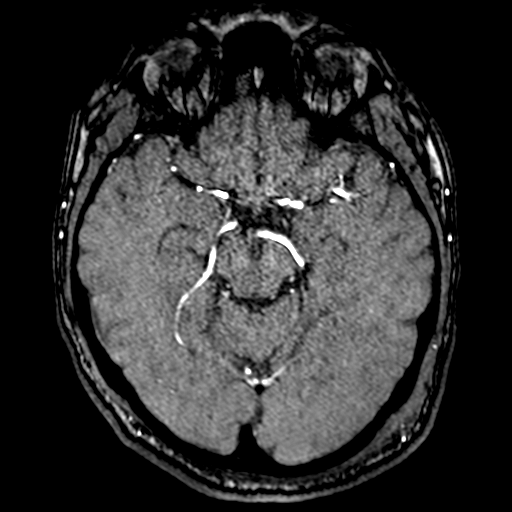
[im 99/172]
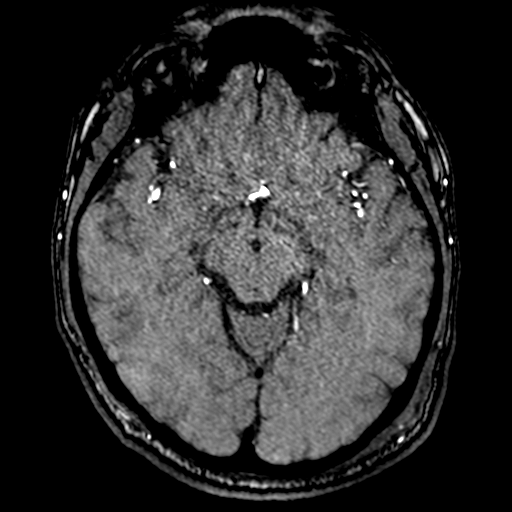
[im 121/172]
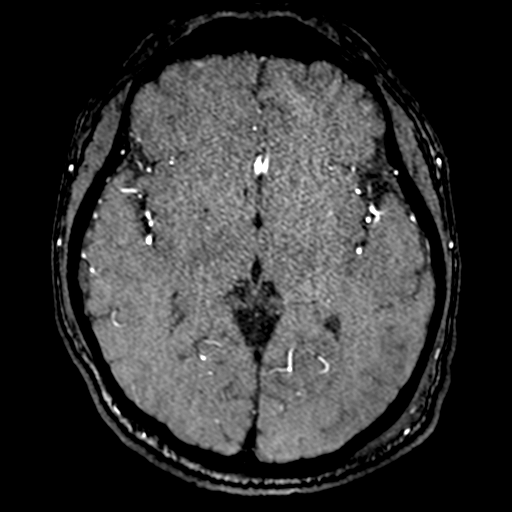
[im 142/172]
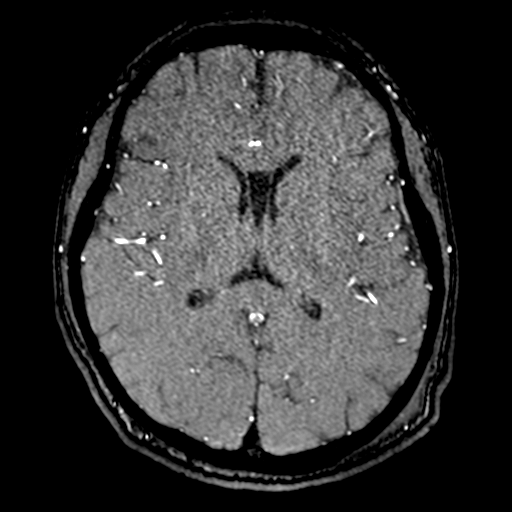
[im 146/172]
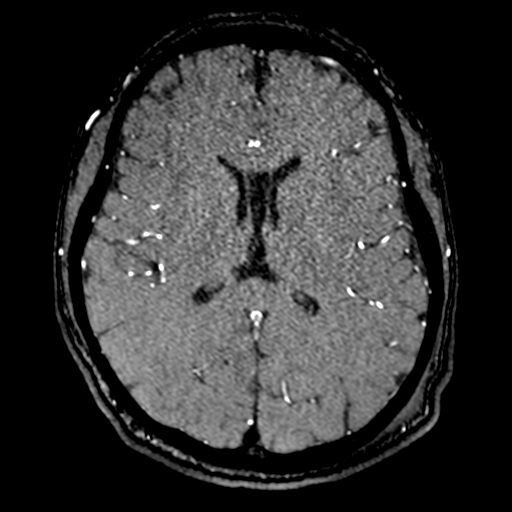
[im 164/172]
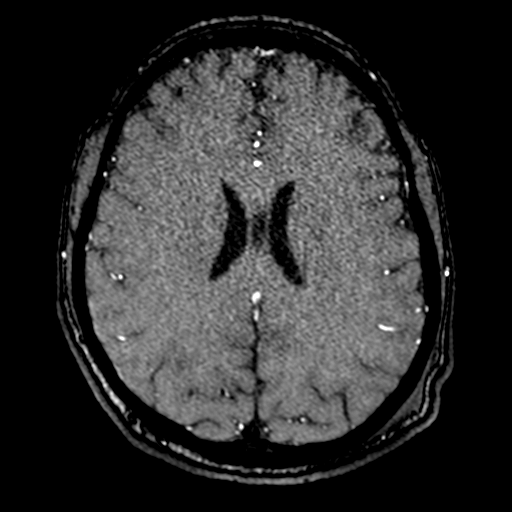

[19 of 48 positions shown; findings below may reference images not displayed]

FINDINGS: MRA NECK FINDINGS

Time-of-flight neck MRA demonstrates antegrade flow in both cervical
carotid and vertebral arteries. Relatively codominant vertebrals.
Both carotid bifurcations appear normal. Antegrade flow signal
continues to the skull base. No stenosis or irregularity is evident.

MRA HEAD FINDINGS

Incidental cavum septum pellucidum. No ventriculomegaly or
intracranial mass effect is evident.

Antegrade flow in the posterior circulation with slightly dominant
left vertebral V4 segment. Normal left PICA origin. Normal
vertebrobasilar junction. Dominant appearing right AICA is patent.
Patent basilar artery without stenosis. Normal SCA and left PCA
origins. Fetal type right PCA origin. Left posterior communicating
artery diminutive or absent. Bilateral PCA branches are within
normal limits.

Antegrade flow in both ICA siphons. No siphon stenosis. Normal
ophthalmic and right posterior communicating artery origins. Patent
carotid termini. Normal MCA and ACA origins. Anterior communicating
artery and visible ACA branches are within normal limits. Right MCA
M1, right MCA bifurcation and visible right MCA branches are within
normal limits. Left MCA bifurcates just off the terminus without
stenosis. Left MCA branches are within normal limits.
IMPRESSION: Negative MRA Head and Neck.

## 2020-05-31 NOTE — Discharge Summary (Signed)
Physician Discharge Summary  Keelen Patrica Duel BDZ:[id number] DOB: Aug 23, 1995 DOA: 05/29/2020  PCP: Pcp, No  Admit date: 05/29/2020 Discharge date: 05/31/2020  Admitted From: Home Disposition: Left AMA  Recommendations for Outpatient Follow-up:  1. Follow up with PCP in 1-2 weeks 2. Follow up with Neurology as an outpatient  3. Please obtain CMP/CBC, Mag, Phos in one week 4. Please follow up on the following pending results:  Home Health: No Equipment/Devices: None   Discharge Condition: Guarded  CODE STATUS: FULL CODE Diet recommendation: Heart Healthy Diet  Brief/Interim Summary: The patient is a Hispanic 25 year old male with no real past medical history who presented to the ED via EMS after he was found unresponsive in a parking lot earlier yesterday morning suspected from a fentanyl overdose.  It is unclear how long the patient was found down.  Fire rescue administered intranasal Narcan prior to arrival to the ED patient immediately responded.  He was also noted to be hypoxic at 70% initially on upon the scene.  Upon arrival to ED patient was confused, agitated and combative.  Translator in the ED was able to help the ED provider and obtain some of the history the patient reported that he uses fentanyl the night before.  He continued to thrash in the bed and was not cooperative during the ED provider work-up and but then given Ativan due to his withdrawal symptoms.  He was initially placed on 2 L of nasal cannula and O2 saturations improved to 100%.  He is noted to have an elevated creatinine and abnormal LFTs and a troponin level that was elevated and trended up to 1566.  WBC was 13.7.  He had a chest x-ray showed a right lower lobe retrocardiac faint scattered opacity.  CTA of the chest was negative for PE but did show patchy bibasilar and posterior left upper lobe infiltrates likely related to aspiration.  Patient had a head CT which showed low-density in right basal ganglia  suggestive of an acute infarct.  The ED provider spoke with the cardiologist on-call who felt that his troponin elevation was certainly demand ischemia and recommended echo as well as formal cardiology consult if echo is abnormal.  ED provider also spoke with neurology about the CT findings who recommended brain MRI transfer to [facility] as well as further work-up including EEG.  In the ED received a 500 normal saline bolus, started on Unasyn and was given Narcan prior to arrival.  Once patient arrived to [facility] he was evaluated and an MRI brain was done which showed an acute perforated infarct in the bilateral basal ganglia and acute left frontal white matter infarct associated with edema without significant mass-effect and given his multiple vascular territories they considered embolic etiology.  Patient also had a mild paranasal sinus mucosal thickening and a diffuse T1 hypointensity of the visualized cervical bone marrow which is nonspecific but could be related to chronic anemia or chronic hypoxia less likely lymphoproliferative disorder.  Neurology evaluated and given his multiple strokes in the setting of polysubstance abuse likely related to vasospasm secondary to stimulant use however given his high risky behavior they felt that he could have a potential for neurosyphilis.  They recommended continuing stroke work-up and obtaining a TTE and as well as a TEE.  Stroke labs were initiated and they recommended an MRI of the brain without contrast and as well as an MRI of the neck with and without contrast.  They are going to obtain a carotid Doppler as  well as the MRA neck was to motion limited as well as obtain echocardiogram and frequent neurochecks.  Patient was given prophylactic therapy and given aspirin 325 mg followed by 81 mg daily as well as considering Plavix load with 300 mg and then 75 mg daily for 21 days if LP is not needed.  Given his encephalopathy B12, thiamine and HIV as well as  ammonia versus to see reversible causes and he started on empiric thiamine supplementation 5 mg IV daily for 3 days and then 100 mg daily.  CK was significantly elevated so he was given IV normal saline.  PT and OT recommending CIR recommended CIR and a regular diet with thin liquids.  Currently his echo was done and neurology is following   Frequently and patient was started on a clonidine withdrawal protocol as well as IV lorazepam yesterday evening and overnight he became a little bit more agitated.  This morning he is awake and alert and oriented per nursing staff and signed out AGAINST MEDICAL ADVICE prior to being seen.  Discharge Diagnoses:  Principal Problem:   Overdose Active Problems:   Abnormal head CT   Elevated troponin   Rhabdomyolysis   Elevated LFTs   Acute metabolic encephalopathy   Cerebrovascular accident (CVA) (HCC)  Multiple Drug Overdose -S/p narcan x1 with initial improvement but seemed to go through withdrawal in the ED so received ativan and now sleeping, protecting airway; He remains somnolent yesterday AM but was awake and Alert and Oriented x4 per nursing  -Advise against recreational drugs once awake -UDS ordered and was POSITIVE For Amphetamines, Benzodiazepines, and THC and admitted to Fentanyl use to the EDP -He admitted to Doing Crystal Meth, Sniffing Fentanyl and doing Xanax -Was placed on a Clonidine Withdrawal and IV Lorazepam withdrawal protocol -Patient was not suicidal but was trying to get hihg -Continue to monitor closely and he became agitated prior to be seen and signed out AMA being of sound mind, nursing staff explained to him the risks of signing out AGAINST MEDICAL ADVICE including but not limited to, worsening, decompensation and even possible death  Concern for aspiration event with Developing Aspiration Pneumonia -CTA chest with patchy bibasilar and posterior LUL infiltrates -Leukocytosis and tachycardia (could be from infection vs.  Reactive) -Empiric Ceftriaxone and azithromycin for now; Recieved -Aspiration precautions -He signed out AMA while he is still getting treated  Acute Multiple CVA's  -In the setting of his Polysubstance Abuse  -Initial Head CT done and showed Low density in the right basal ganglia suggestive of an acute infarct -Neurology aware and recommends MRI brain to ensure that there is no stroke or changes associated with fentanyl overdose. -MRI done and showed "Acute perforator infarcts in bilateral basal ganglia and acute left parietal white matter infarct, as detailed above. Associated edema without significant mass effect. Given involvement of multiple vascular territories, consider embolic etiology. Mild paranasal sinus mucosal thickening with bilateral sphenoid sinus air-fluid levels. Diffuse T1 hypointensity of the visualized cervical bone marrow, which is nonspecific but could be related to chronic anemia, chronic hypoxia (such as in smokers), or less likely a lymphoproliferative disorder." -Checking ECHO and has been done and pending Read -Neurology recommending continuing stroke work-up and obtaining a TEE if TTE is negative.   -Stroke labs were initiated and they recommended an MRA of the brain without contrast and as well as an MRI of the neck with and without contrast.   -They are going to obtain a carotid Doppler only if the  MRA neck is too motion limited as well  -Frequent neurochecks.   -Patient was given prophylactic therapy and given aspirin 325 mg followed by 81 mg daily as well as considering Plavix load with 300 mg and then 75 mg daily for 21 days if LP is not needed. -Will check Lipid panel and HbA1c and Neurology recommending RPR and if it is Positive consider LP for Neurosyphilis Eval -Lipid panel done and showed a total cholesterol/HDL ratio 2.9, cholesterol level 132, HDL 45, LDL 76, triglycerides of 54, VLDL of 11 -Hemoglobin A1c was 6.0 -C/w Telemetry monitoring -PT/OT/SLP  recommending SNF -Further Care per Neurology  and they will see the patient today but he signed out against medical vice  Elevated troponin, suspect demand ischemia -Trop 1323 -> 1566 -> 1487 -> 1317, EKG Sinus tachycardia without pathologic ST changes -ED provider discussed with cardiology who felt this was almost certainly demand ischemia, recommended an echo and formal cardiology consult if echo was abnormal -ECHOCardiogram showed normal left ventricular ejection fraction of 55 to 60% with no regional wall motion abnormalities and the left ventricular diastolic parameters were normal.  And it was read as no intracardiac source of embolism detected on this transthoracic study. -TEE was to be explored and pursued however he signed out AMA  Mild Rhabdomyolysis -Unclear etiology but likley from Drug overdose -CK went from 1410 -> 2772 -> 2189 morning it had trended down to 953 -C/w IV fluids with NS at 200 mL/hr and follow up in AM -Repeat CK in AM  Hyperammonemia -Patient's ammonia level went from 30 and is now 47 and repeat still pending this a.m.  Prediabetes -Patient's hemoglobin A1c is 6.0 -Continue monitor blood sugars carefully and if necessary will place on sensitive NovoLog sliding scale insulin AC  Abnormal/Elevated LFTs -Likely in the setting of rhabdo and drug abuse -Patient's AST on admission was 387 and then trended down to 330 and further down to 145 Patient's ALT was trended up to 451 and then trended down to 344 -U/S of the Abdomen showed "Limited exam as above. Diffuse increased hepatic echogenicity consistent fatty infiltration or hepatocellular disease. Probable focal fatty sparing adjacent to the gallbladder fossa.  No gallstones. Common bile duct not visualized. Mild ascites." -If continue to worsen may need Gastroenterology    AKI -Unknown baseline, suspect slight AKI -Patient's BUNs/creatinine went from 25/1.66 is now 6/0.82 -Continue IV fluid  hydration as above -Avoid nephrotoxic medications, contrast dyes, hypotension and renally dose medications -Repeat CMP in a.m.  Acute metabolic encephalopathy, multifactorial: fentanyl overdose, ativan, suspected basal ganglia infarct and suspected pneumonia -Checked ammonia level and went from 30 -> 47 -Plan as above; Neurology Following -His encephalopathy and somnolence improved and he became agitated and signed out AGAINST MEDICAL ADVICE  Hypokalemia -Patient's K+ was 4.6 -Check Mag Level in the AM -Continue to Monitor and Replete as Necessary -Repeat CMP in the AM   Discharge Instructions   Allergies as of 05/31/2020   No Known Allergies     Medication List    You have not been prescribed any medications.     No Known Allergies  Consultations:  Neurology  Procedures/Studies: DG Cervical Spine 2 or 3 views  Result Date: 05/29/2020 CLINICAL DATA:  Unresponsive patient.  Screening for MRI. EXAM: CERVICAL SPINE - 2-3 VIEW COMPARISON:  None. FINDINGS: There is no evidence of cervical spine fracture or prevertebral soft tissue swelling. Alignment is normal. No other significant bone abnormalities are identified. No metallic or radiopaque foreign  body. IMPRESSION: Negative cervical spine radiographs. Electronically Signed   By: Kennith Center M.D.   On: 05/29/2020 13:53   DG Pelvis 1-2 Views  Result Date: 05/29/2020 CLINICAL DATA:  Pre MRI.  Patient unresponsive. EXAM: PELVIS - 1-2 VIEW COMPARISON:  None. FINDINGS: Contrast noted in the ureters and bladder from a recent chest CT. The bony pelvis is intact. No radiopaque foreign bodies are identified. IMPRESSION: No radiopaque foreign bodies. Electronically Signed   By: Rudie Meyer M.D.   On: 05/29/2020 13:49   DG Abd 1 View  Result Date: 05/29/2020 CLINICAL DATA:  Pre MRI. Patient unresponsive. Evaluate for foreign body that may preclude MRI. EXAM: ABDOMEN - 1 VIEW COMPARISON:  None. FINDINGS: Contrast noted in the renal  collecting systems and bladder from a recent CT scan. The abdominal bowel gas pattern is unremarkable. No radiopaque foreign bodies are identified. IMPRESSION: No radiopaque foreign bodies are identified. Electronically Signed   By: Rudie Meyer M.D.   On: 05/29/2020 13:49   CT Head Wo Contrast  Result Date: 05/29/2020 CLINICAL DATA:  Mental status change.  Found unresponsive. EXAM: CT HEAD WITHOUT CONTRAST TECHNIQUE: Contiguous axial images were obtained from the base of the skull through the vertex without intravenous contrast. COMPARISON:  None. FINDINGS: Brain: There is abnormal hypoattenuation involving the right lentiform nucleus and internal capsule. The left basal ganglia appear intact. No acute cortically based infarct, intracranial hemorrhage, mass/mass effect, or extra-axial fluid collection is identified. The ventricles are normal in size. A cavum septum pellucidum is incidentally noted. Vascular: No hyperdense vessel. Skull: No fracture or suspicious osseous lesion. Sinuses/Orbits: Mild mucosal thickening in the included paranasal sinuses. Clear mastoid air cells. Disc conjugate gaze. Other: None IMPRESSION: Low-density in the right basal ganglia suggestive of an acute infarct. Electronically Signed   By: Sebastian Ache M.D.   On: 05/29/2020 10:32   CT Angio Chest PE W and/or Wo Contrast  Result Date: 05/29/2020 CLINICAL DATA:  Unresponsive, hypoxic, high probability PE EXAM: CT ANGIOGRAPHY CHEST WITH CONTRAST TECHNIQUE: Multidetector CT imaging of the chest was performed using the standard protocol during bolus administration of intravenous contrast. Multiplanar CT image reconstructions and MIPs were obtained to evaluate the vascular anatomy. CONTRAST:  OMNIPAQUE IOHEXOL 350 MG/ML SOLN COMPARISON:  None. FINDINGS: Cardiovascular: Heart size normal. No pericardial effusion. Satisfactory opacification of pulmonary arteries noted, and there is no evidence of pulmonary emboli. Motion degrades  some of the images through the lung bases. Adequate contrast opacification of the thoracic aorta with no evidence of dissection, aneurysm, or stenosis. There is bovine variant brachiocephalic arch anatomy without proximal stenosis. No significant atheromatous change. Mediastinum/Nodes: No mass or adenopathy. Lungs/Pleura: No pleural effusion. No pneumothorax. Patchy ground-glass and airspace opacities in both lower lobes left greater than right. Scattered ground-glass alveolar opacities in the posterior left upper lobe. Upper Abdomen: No acute abnormality. Musculoskeletal: No chest wall abnormality. No acute or significant osseous findings. Review of the MIP images confirms the above findings. IMPRESSION: 1. Negative for acute PE or thoracic aortic dissection. 2. Patchy bibasilar and posterior left upper lobe infiltrates, nonspecific but possibly related to aspiration. Electronically Signed   By: Corlis Leak M.D.   On: 05/29/2020 10:17   MR ANGIO HEAD WO CONTRAST  Result Date: 05/31/2020 CLINICAL DATA:  25 year old male found unresponsive in grocery store parking lot. Brain MRI reveals asymmetric abnormal diffusion in both globus pallidus, but also the right caudate/corona radiata and left periatrial white matter. EXAM: MRA HEAD WITHOUT CONTRAST  MRA NECK WITHOUT CONTRAST TECHNIQUE: Angiographic images of the Circle of Willis were obtained using MRA technique without intravenous contrast. Angiographic images of the neck were obtained using MRA technique without intravenous contrast. Carotid stenosis measurements (when applicable) are obtained utilizing NASCET criteria, using the distal internal carotid diameter as the denominator. COMPARISON:  Brain MRI 05/29/2020. FINDINGS: MRA NECK FINDINGS Time-of-flight neck MRA demonstrates antegrade flow in both cervical carotid and vertebral arteries. Relatively codominant vertebrals. Both carotid bifurcations appear normal. Antegrade flow signal continues to the skull  base. No stenosis or irregularity is evident. MRA HEAD FINDINGS Incidental cavum septum pellucidum. No ventriculomegaly or intracranial mass effect is evident. Antegrade flow in the posterior circulation with slightly dominant left vertebral V4 segment. Normal left PICA origin. Normal vertebrobasilar junction. Dominant appearing right AICA is patent. Patent basilar artery without stenosis. Normal SCA and left PCA origins. Fetal type right PCA origin. Left posterior communicating artery diminutive or absent. Bilateral PCA branches are within normal limits. Antegrade flow in both ICA siphons. No siphon stenosis. Normal ophthalmic and right posterior communicating artery origins. Patent carotid termini. Normal MCA and ACA origins. Anterior communicating artery and visible ACA branches are within normal limits. Right MCA M1, right MCA bifurcation and visible right MCA branches are within normal limits. Left MCA bifurcates just off the terminus without stenosis. Left MCA branches are within normal limits. IMPRESSION: Negative MRA Head and Neck. Electronically Signed   By: Odessa Fleming M.D.   On: 05/31/2020 04:41   MR ANGIO NECK WO CONTRAST  Result Date: 05/31/2020 CLINICAL DATA:  25 year old male found unresponsive in grocery store parking lot. Brain MRI reveals asymmetric abnormal diffusion in both globus pallidus, but also the right caudate/corona radiata and left periatrial white matter. EXAM: MRA HEAD WITHOUT CONTRAST MRA NECK WITHOUT CONTRAST TECHNIQUE: Angiographic images of the Circle of Willis were obtained using MRA technique without intravenous contrast. Angiographic images of the neck were obtained using MRA technique without intravenous contrast. Carotid stenosis measurements (when applicable) are obtained utilizing NASCET criteria, using the distal internal carotid diameter as the denominator. COMPARISON:  Brain MRI 05/29/2020. FINDINGS: MRA NECK FINDINGS Time-of-flight neck MRA demonstrates antegrade flow in  both cervical carotid and vertebral arteries. Relatively codominant vertebrals. Both carotid bifurcations appear normal. Antegrade flow signal continues to the skull base. No stenosis or irregularity is evident. MRA HEAD FINDINGS Incidental cavum septum pellucidum. No ventriculomegaly or intracranial mass effect is evident. Antegrade flow in the posterior circulation with slightly dominant left vertebral V4 segment. Normal left PICA origin. Normal vertebrobasilar junction. Dominant appearing right AICA is patent. Patent basilar artery without stenosis. Normal SCA and left PCA origins. Fetal type right PCA origin. Left posterior communicating artery diminutive or absent. Bilateral PCA branches are within normal limits. Antegrade flow in both ICA siphons. No siphon stenosis. Normal ophthalmic and right posterior communicating artery origins. Patent carotid termini. Normal MCA and ACA origins. Anterior communicating artery and visible ACA branches are within normal limits. Right MCA M1, right MCA bifurcation and visible right MCA branches are within normal limits. Left MCA bifurcates just off the terminus without stenosis. Left MCA branches are within normal limits. IMPRESSION: Negative MRA Head and Neck. Electronically Signed   By: Odessa Fleming M.D.   On: 05/31/2020 04:41   MR BRAIN WO CONTRAST  Result Date: 05/29/2020 EXAM: MRI HEAD WITHOUT CONTRAST TECHNIQUE: Multiplanar, multiecho pulse sequences of the brain and surrounding structures were obtained without intravenous contrast. COMPARISON:  Same day CT head. FINDINGS: Brain: Acute 1.4  cm perforator infarct in the right putamen/globus pallidus (series 5, image 71) with additional punctate acute infarct in the adjacent anterior limb of the right internal capsule (series 5, image 75). Acute 0.6 cm perforator infarct in the left globus pallidus. Amorphous acute infarct in the left posterior parietal white matter (series 5, image 73). Associated edema without  significant mass effect. No acute hemorrhage. No hydrocephalus. Cavum septum pellucidum et vergae, anatomic variant. No mass lesion. No midline shift. Basal cisterns are patent. No extra-axial fluid collection. Vascular: Major arterial flow voids are maintained at the skull base. Skull and upper cervical spine: Diffuse T1 hypointensity of the visualized cervical bone marrow. No focal marrow replacing lesion. Sinuses/Orbits: Mild mucosal thickening of scattered ethmoid air cells, bilateral sphenoid sinuses and maxillary sinuses with bilateral sphenoid sinus air-fluid levels and frothy secretions. Other: No mastoid effusions. IMPRESSION: 1. 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 Associated edema without significant mass effect. Given involvement of multiple vascular territories, consider embolic etiology. 2. Mild paranasal sinus mucosal thickening with bilateral sphenoid sinus air-fluid levels. 3. Diffuse T1 hypointensity of the visualized cervical bone marrow, which is nonspecific but could be related to chronic anemia, chronic hypoxia (such as in smokers), or less likely a lymphoproliferative disorder. Electronically Signed   By: Feliberto Harts MD   On: 05/29/2020 15:10   DG Chest Portable 1 View  Result Date: 05/29/2020 CLINICAL DATA:  evaluate for aspiration PNA, suspected opiod overdose, hypoxic EXAM: PORTABLE CHEST - 1 VIEW COMPARISON:  None available. FINDINGS: The mediastinal contours are within normal limits. No cardiomegaly. Right lower lobe and retrocardiac faint scattered opacities. No significant pleural effusion or evidence of pneumothorax. No acute osseous abnormality. IMPRESSION: Right lower lobe and retrocardiac faint scattered opacities which are nonspecific but could represent sequela of aspiration, multifocal pneumonia, or subsegmental atelectasis. Electronically Signed   By: Marliss Coots MD   On: 05/29/2020  08:35   ECHOCARDIOGRAM COMPLETE  Result Date: 05/30/2020    ECHOCARDIOGRAM REPORT   Patient Name:   KENLY XIAO [facility] Date of Exam: 05/30/2020 Medical Rec #:  [id number]             Height: Accession #:    [id number]            Weight:       174.6 lb Date of Birth:  1995/07/10             BSA:          1.845 m Patient Age:    24 years              BP:           123/94 mmHg Patient Gender: M                     HR:           95 bpm. Exam Location:  Inpatient Procedure: 2D Echo, Cardiac Doppler, Limited Color Doppler and Intracardiac            Opacification Agent Indications:    Elevated troponin                 Stroke  History:        Patient has no prior history of Echocardiogram examinations.                 Elevated troponin. Stroke. Polysubstance abuse.  Sonographer:    Merton Border  Vinnie Level (AE) Referring Phys: [id number] JARED E SEGAL IMPRESSIONS  1. Left ventricular ejection fraction, by estimation, is 55 to 60%. The left ventricle has normal function. The left ventricle has no regional wall motion abnormalities. Left ventricular diastolic parameters were normal.  2. Right ventricular systolic function is normal. The right ventricular size is normal.  3. The mitral valve is normal in structure. Trivial mitral valve regurgitation. No evidence of mitral stenosis.  4. The aortic valve is tricuspid. Aortic valve regurgitation is not visualized. No aortic stenosis is present.  5. The inferior vena cava is normal in size with greater than 50% respiratory variability, suggesting right atrial pressure of 3 mmHg. Comparison(s): No prior Echocardiogram. Conclusion(s)/Recommendation(s): No intracardiac source of embolism detected on this transthoracic study. A transesophageal echocardiogram is recommended to exclude cardiac source of embolism if clinically indicated. FINDINGS  Left Ventricle: Left ventricular ejection fraction, by estimation, is 55 to 60%. The left ventricle has normal function. The left ventricle has no  regional wall motion abnormalities. Definity contrast agent was given IV to delineate the left ventricular  endocardial borders. The left ventricular internal cavity size was normal in size. There is no left ventricular hypertrophy. Left ventricular diastolic parameters were normal. Right Ventricle: The right ventricular size is normal. No increase in right ventricular wall thickness. Right ventricular systolic function is normal. Left Atrium: Left atrial size was normal in size. Right Atrium: Right atrial size was normal in size. Pericardium: There is no evidence of pericardial effusion. Mitral Valve: The mitral valve is normal in structure. Trivial mitral valve regurgitation. No evidence of mitral valve stenosis. Tricuspid Valve: The tricuspid valve is normal in structure. Tricuspid valve regurgitation is trivial. Aortic Valve: The aortic valve is tricuspid. Aortic valve regurgitation is not visualized. No aortic stenosis is present. Aortic valve mean gradient measures 3.0 mmHg. Aortic valve peak gradient measures 6.0 mmHg. Aortic valve area, by VTI measures 2.49 cm. Pulmonic Valve: The pulmonic valve was normal in structure. Pulmonic valve regurgitation is trivial. Aorta: The aortic root and ascending aorta are structurally normal, with no evidence of dilitation. Venous: The inferior vena cava is normal in size with greater than 50% respiratory variability, suggesting right atrial pressure of 3 mmHg. IAS/Shunts: No atrial level shunt detected by color flow Doppler.  LEFT VENTRICLE PLAX 2D LVIDd:         5.00 cm  Diastology LVIDs:         3.80 cm  LV e' medial:  7.83 cm/s LV PW:         1.30 cm  LV e' lateral: 8.27 cm/s LV IVS:        1.10 cm LVOT diam:     2.00 cm LV SV:         48 LV SV Index:   26 LVOT Area:     3.14 cm  RIGHT VENTRICLE            IVC RV Basal diam:  3.10 cm    IVC diam: 2.00 cm RV S prime:     9.03 cm/s TAPSE (M-mode): 1.7 cm LEFT ATRIUM             Index       RIGHT ATRIUM           Index  LA diam:        3.20 cm 1.73 cm/m  RA Area:     12.40 cm LA Vol (A2C):   46.0 ml 24.94 ml/m RA Volume:  28.00 ml  15.18 ml/m LA Vol (A4C):   45.5 ml 24.67 ml/m LA Biplane Vol: 48.4 ml 26.24 ml/m  AORTIC VALVE AV Area (Vmax):    2.45 cm AV Area (Vmean):   2.27 cm AV Area (VTI):     2.49 cm AV Vmax:           122.00 cm/s AV Vmean:          83.700 cm/s AV VTI:            0.193 m AV Peak Grad:      6.0 mmHg AV Mean Grad:      3.0 mmHg LVOT Vmax:         95.30 cm/s LVOT Vmean:        60.600 cm/s LVOT VTI:          0.153 m LVOT/AV VTI ratio: 0.79  AORTA Ao Root diam: 3.40 cm Ao Asc diam:  2.80 cm  SHUNTS Systemic VTI:  0.15 m Systemic Diam: 2.00 cm Laurance Flatten MD Electronically signed by Laurance Flatten MD Signature Date/Time: 05/30/2020/5:14:50 PM    Final    US Abdomen Limited RUQ (LIVER/GB)  Result Date: 05/29/2020 CLINICAL DATA:  Abnormal LFTs. EXAM: ULTRASOUND ABDOMEN LIMITED RIGHT UPPER QUADRANT COMPARISON:  No prior. FINDINGS: Gallbladder: Exam limited as patient was nonresponsive. Also prominent amount of overlying bowel gas limited the exam. No gallstones or wall thickening visualized. No sonographic Murphy sign noted by sonographer. Common bile duct: Diameter: Not visualized Liver: Increased echogenicity consistent with fatty infiltration and or hepatocellular disease. Decreased attenuation liver adjacent to the gallbladder fossa consistent with focal fatty sparing. Portal vein is patent on color Doppler imaging with normal direction of blood flow towards the liver. Other: Mild ascites. IMPRESSION: Limited exam as above. Diffuse increased hepatic echogenicity consistent fatty infiltration or hepatocellular disease. Probable focal fatty sparing adjacent to the gallbladder fossa. 2.  No gallstones.  Common bile duct not visualized. 3.  Mild ascites. Electronically Signed   By: Maisie Fus  Register   On: 05/29/2020 13:36    Subjective: Patient was not seen this morning as he signed out AGAINST  MEDICAL ADVICE prior to being evaluated.  He was extremely belligerent and of sound mind and alert and oriented x3.  Nursing states that he ambulated and signed out and left after they explained to them the risks of worsening, decompensation and even possible death.  He has a very high risk for readmission.  Discharge Exam: Vitals:   05/31/20 0006 05/31/20 0321  BP: 114/70 109/79  Pulse: 76 78  Resp:  19  Temp:  98.4 F (36.9 C)  SpO2:  100%   Vitals:   05/30/20 2010 05/30/20 2303 05/31/20 0006 05/31/20 0321  BP: 135/86 118/81 114/70 109/79  Pulse: 84 91 76 78  Resp: (!) Temp: 99.3 F (37.4 C) 98.4 F (36.9 C)  98.4 F (36.9 C)  TempSrc: Axillary Axillary  Axillary  SpO2: 98% 100%  100%  Weight:        No physical examination as the patient signed out AGAINST MEDICAL ADVICE prior to being evaluated  The results of significant diagnostics from this hospitalization (including imaging, microbiology, ancillary and laboratory) are listed below for reference.     Microbiology: Recent Results (from the past 240 hour(s))  Resp Panel by RT-PCR (Flu A&B, Covid) Nasopharyngeal Swab     Status: None   Collection Time: 05/29/20  8:15 AM   Specimen: Nasopharyngeal Swab; Nasopharyngeal(NP) swabs  in vial transport medium  Result Value Ref Range Status   SARS Coronavirus 2 by RT PCR NEGATIVE NEGATIVE Final    Comment: (NOTE) SARS-CoV-2 target nucleic acids are NOT DETECTED.  The SARS-CoV-2 RNA is generally detectable in upper respiratory specimens during the acute phase of infection. The lowest concentration of SARS-CoV-2 viral copies this assay can detect is 138 copies/mL. A negative result does not preclude SARS-Cov-2 infection and should not be used as the sole basis for treatment or other patient management decisions. A negative result may occur with  improper specimen collection/handling, submission of specimen other than nasopharyngeal swab, presence of viral  mutation(s) within the areas targeted by this assay, and inadequate number of viral copies(<138 copies/mL). A negative result must be combined with clinical observations, patient history, and epidemiological information. The expected result is Negative.  Fact Sheet for Patients:  BloggerCourse.comhttps://www.fda.gov/media/152166/download  Fact Sheet for Healthcare Providers:  SeriousBroker.ithttps://www.fda.gov/media/152162/download  This test is no t yet approved or cleared by the [place] FDA and  has been authorized for detection and/or diagnosis of SARS-CoV-2 by FDA under an Emergency Use Authorization (EUA). This EUA will remain  in effect (meaning this test can be used) for the duration of the COVID-19 declaration under Section 564(b)(1) of the Act, 21 U.S.C.section 360bbb-3(b)(1), unless the authorization is terminated  or revoked sooner.       Influenza A by PCR NEGATIVE NEGATIVE Final   Influenza B by PCR NEGATIVE NEGATIVE Final    Comment: (NOTE) The Xpert Xpress SARS-CoV-2/FLU/RSV plus assay is intended as an aid in the diagnosis of influenza from Nasopharyngeal swab specimens and should not be used as a sole basis for treatment. Nasal washings and aspirates are unacceptable for Xpert Xpress SARS-CoV-2/FLU/RSV testing.  Fact Sheet for Patients: BloggerCourse.comhttps://www.fda.gov/media/152166/download  Fact Sheet for Healthcare Providers: SeriousBroker.ithttps://www.fda.gov/media/152162/download  This test is not yet approved or cleared by the [place] FDA and has been authorized for detection and/or diagnosis of SARS-CoV-2 by FDA under an Emergency Use Authorization (EUA). This EUA will remain in effect (meaning this test can be used) for the duration of the COVID-19 declaration under Section 564(b)(1) of the Act, 21 U.S.C. section 360bbb-3(b)(1), unless the authorization is terminated or revoked.  Performed at [facility], [address] [place], [place] [address]     Labs: BNP (last 3  results) No results for input(s): BNP in the last 8760 hours. Basic Metabolic Panel: Recent Labs  Lab 05/29/20 0815 05/30/20 0610 05/31/20 0442  NA 143 138 138  K 3.9 3.3* 4.6  CL 110 107 109  CO2 22 25 23   GLUCOSE 118* 103* 107*  BUN 25* 12 6  CREATININE 1.66* 0.81 0.82  CALCIUM 9.0 8.6* 8.7*   Liver Function Tests: Recent Labs  Lab 05/29/20 0815 05/30/20 0610 05/31/20 0442  AST 387* 330* 145*  ALT 372* 451* 344*  ALKPHOS 84 58 55  BILITOT 0.8 0.6 1.4*  PROT 8.3* 6.2* 5.7*  ALBUMIN 4.6 3.3* 3.1*   No results for input(s): LIPASE, AMYLASE in the last 168 hours. Recent Labs  Lab 05/29/20 1339 05/29/20 2115  AMMONIA 30 47*   CBC: Recent Labs  Lab 05/29/20 0815 05/30/20 0610 05/31/20 0442  WBC 13.7* 12.7* 9.0  NEUTROABS 11.4*  --   --   HGB 15.5 13.7 12.9*  HCT 47.6 42.5 38.0*  MCV 90.8 90.8 87.4  PLT 206 167 PLATELET CLUMPS NOTED ON SMEAR, UNABLE TO ESTIMATE   Cardiac Enzymes: Recent Labs  Lab 05/29/20  0815 05/29/20 2115 05/30/20 0610 05/30/20 1637 05/31/20 0442  CKTOTAL 1,410* 2,772* 2,189* 1,631* 953*   BNP: Invalid input(s): POCBNP CBG: Recent Labs  Lab 05/29/20 0848  GLUCAP 113*   D-Dimer No results for input(s): DDIMER in the last 72 hours. Hgb A1c Recent Labs    05/29/20 2115  HGBA1C 6.0*   Lipid Profile Recent Labs    05/29/20 2115  CHOL 132  HDL 45  LDLCALC 76  TRIG 54  CHOLHDL 2.9   Thyroid function studies No results for input(s): TSH, T4TOTAL, T3FREE, THYROIDAB in the last 72 hours.  Invalid input(s): FREET3 Anemia work up Recent Labs    05/29/20 2115  VITAMINB12 644   Urinalysis    Component Value Date/Time   COLORURINE YELLOW 05/29/2020 1209   APPEARANCEUR CLEAR 05/29/2020 1209   LABSPEC 1.016 05/29/2020 1209   PHURINE 6.0 05/29/2020 1209   GLUCOSEU >=500 (A) 05/29/2020 1209   HGBUR MODERATE (A) 05/29/2020 1209   BILIRUBINUR NEGATIVE 05/29/2020 1209   KETONESUR NEGATIVE 05/29/2020 1209   PROTEINUR 30  (A) 05/29/2020 1209   NITRITE NEGATIVE 05/29/2020 1209   LEUKOCYTESUR NEGATIVE 05/29/2020 1209   Sepsis Labs Invalid input(s): PROCALCITONIN,  WBC,  LACTICIDVEN Microbiology Recent Results (from the past 240 hour(s))  Resp Panel by RT-PCR (Flu A&B, Covid) Nasopharyngeal Swab     Status: None   Collection Time: 05/29/20  8:15 AM   Specimen: Nasopharyngeal Swab; Nasopharyngeal(NP) swabs in vial transport medium  Result Value Ref Range Status   SARS Coronavirus 2 by RT PCR NEGATIVE NEGATIVE Final    Comment: (NOTE) SARS-CoV-2 target nucleic acids are NOT DETECTED.  The SARS-CoV-2 RNA is generally detectable in upper respiratory specimens during the acute phase of infection. The lowest concentration of SARS-CoV-2 viral copies this assay can detect is 138 copies/mL. A negative result does not preclude SARS-Cov-2 infection and should not be used as the sole basis for treatment or other patient management decisions. A negative result may occur with  improper specimen collection/handling, submission of specimen other than nasopharyngeal swab, presence of viral mutation(s) within the areas targeted by this assay, and inadequate number of viral copies(<138 copies/mL). A negative result must be combined with clinical observations, patient history, and epidemiological information. The expected result is Negative.  Fact Sheet for Patients:  BloggerCourse.com  Fact Sheet for Healthcare Providers:  SeriousBroker.it  This test is no t yet approved or cleared by the [place] FDA and  has been authorized for detection and/or diagnosis of SARS-CoV-2 by FDA under an Emergency Use Authorization (EUA). This EUA will remain  in effect (meaning this test can be used) for the duration of the COVID-19 declaration under Section 564(b)(1) of the Act, 21 U.S.C.section 360bbb-3(b)(1), unless the authorization is terminated  or revoked sooner.        Influenza A by PCR NEGATIVE NEGATIVE Final   Influenza B by PCR NEGATIVE NEGATIVE Final    Comment: (NOTE) The Xpert Xpress SARS-CoV-2/FLU/RSV plus assay is intended as an aid in the diagnosis of influenza from Nasopharyngeal swab specimens and should not be used as a sole basis for treatment. Nasal washings and aspirates are unacceptable for Xpert Xpress SARS-CoV-2/FLU/RSV testing.  Fact Sheet for Patients: BloggerCourse.com  Fact Sheet for Healthcare Providers: SeriousBroker.it  This test is not yet approved or cleared by the [place] FDA and has been authorized for detection and/or diagnosis of SARS-CoV-2 by FDA under an Emergency Use Authorization (EUA). This EUA will remain in effect (meaning this test  can be used) for the duration of the COVID-19 declaration under Section 564(b)(1) of the Act, 21 U.S.C. section 360bbb-3(b)(1), unless the authorization is terminated or revoked.  Performed at [facility], [address], [place], [place] [address]    Time coordinating discharge: 25 minutes  SIGNED:  Merlene Laughter, DO [facility] 05/31/2020, 7:06 PM Pager is on AMION  If 7PM-7AM, please contact night-coverage [email or web address]

## 2020-05-31 NOTE — Plan of Care (Signed)
  Problem: Education: Goal: Knowledge of disease or condition will improve Outcome: Progressing Goal: Knowledge of secondary prevention will improve Outcome: Progressing Goal: Knowledge of patient specific risk factors addressed and post discharge goals established will improve Outcome: Progressing   

## 2020-06-04 LAB — CULTURE, BLOOD (ROUTINE X 2)
Culture: NO GROWTH
Culture: NO GROWTH
Special Requests: ADEQUATE

## 2020-06-05 LAB — VITAMIN B1: Vitamin B1 (Thiamine): 167 nmol/L (ref 66.5–200.0)

## 2021-08-05 ENCOUNTER — Encounter (HOSPITAL_COMMUNITY): Payer: Self-pay

## 2021-08-05 ENCOUNTER — Inpatient Hospital Stay (HOSPITAL_COMMUNITY)
Admission: EM | Admit: 2021-08-05 | Discharge: 2021-08-08 | DRG: 574 | Discharge status: Against Medical Advice | Payer: Self-pay | Attending: Internal Medicine | Admitting: Internal Medicine

## 2021-08-05 ENCOUNTER — Other Ambulatory Visit: Payer: Self-pay

## 2021-08-05 ENCOUNTER — Observation Stay (HOSPITAL_COMMUNITY): Payer: Self-pay | Admitting: Anesthesiology

## 2021-08-05 ENCOUNTER — Encounter (HOSPITAL_COMMUNITY)
Admission: EM | Discharge status: Against Medical Advice | Payer: Self-pay | Source: Home / Self Care | Attending: Internal Medicine

## 2021-08-05 ENCOUNTER — Observation Stay (HOSPITAL_BASED_OUTPATIENT_CLINIC_OR_DEPARTMENT_OTHER): Payer: Self-pay | Admitting: Anesthesiology

## 2021-08-05 ENCOUNTER — Emergency Department (HOSPITAL_COMMUNITY): Payer: Self-pay

## 2021-08-05 DIAGNOSIS — Z560 Unemployment, unspecified: Secondary | ICD-10-CM

## 2021-08-05 DIAGNOSIS — S60949A Unspecified superficial injury of unspecified finger, initial encounter: Secondary | ICD-10-CM

## 2021-08-05 DIAGNOSIS — Z8673 Personal history of transient ischemic attack (TIA), and cerebral infarction without residual deficits: Secondary | ICD-10-CM

## 2021-08-05 DIAGNOSIS — L089 Local infection of the skin and subcutaneous tissue, unspecified: Secondary | ICD-10-CM | POA: Diagnosis present

## 2021-08-05 DIAGNOSIS — F1590 Other stimulant use, unspecified, uncomplicated: Secondary | ICD-10-CM | POA: Diagnosis present

## 2021-08-05 DIAGNOSIS — S61200A Unspecified open wound of right index finger without damage to nail, initial encounter: Secondary | ICD-10-CM

## 2021-08-05 DIAGNOSIS — B9562 Methicillin resistant Staphylococcus aureus infection as the cause of diseases classified elsewhere: Secondary | ICD-10-CM | POA: Diagnosis present

## 2021-08-05 DIAGNOSIS — L03011 Cellulitis of right finger: Principal | ICD-10-CM | POA: Diagnosis present

## 2021-08-05 DIAGNOSIS — L02511 Cutaneous abscess of right hand: Secondary | ICD-10-CM | POA: Diagnosis present

## 2021-08-05 DIAGNOSIS — R000 Tachycardia, unspecified: Secondary | ICD-10-CM | POA: Diagnosis present

## 2021-08-05 DIAGNOSIS — F1290 Cannabis use, unspecified, uncomplicated: Secondary | ICD-10-CM | POA: Diagnosis present

## 2021-08-05 DIAGNOSIS — S60942A Unspecified superficial injury of right middle finger, initial encounter: Secondary | ICD-10-CM

## 2021-08-05 HISTORY — PX: I & D EXTREMITY: SHX5045

## 2021-08-05 LAB — CBC WITH DIFFERENTIAL/PLATELET
Abs Immature Granulocytes: 0.06 10*3/uL (ref 0.00–0.07)
Basophils Absolute: 0.1 10*3/uL (ref 0.0–0.1)
Basophils Relative: 0 %
Eosinophils Absolute: 0.1 10*3/uL (ref 0.0–0.5)
Eosinophils Relative: 1 %
HCT: 45.6 % (ref 39.0–52.0)
Hemoglobin: 15.2 g/dL (ref 13.0–17.0)
Immature Granulocytes: 0 %
Lymphocytes Relative: 12 %
Lymphs Abs: 1.6 10*3/uL (ref 0.7–4.0)
MCH: 28.9 pg (ref 26.0–34.0)
MCHC: 33.3 g/dL (ref 30.0–36.0)
MCV: 86.7 fL (ref 80.0–100.0)
Monocytes Absolute: 1.3 10*3/uL — ABNORMAL HIGH (ref 0.1–1.0)
Monocytes Relative: 9 %
Neutro Abs: 10.3 10*3/uL — ABNORMAL HIGH (ref 1.7–7.7)
Neutrophils Relative %: 78 %
Platelets: 295 10*3/uL (ref 150–400)
RBC: 5.26 MIL/uL (ref 4.22–5.81)
RDW: 12 % (ref 11.5–15.5)
WBC: 13.4 10*3/uL — ABNORMAL HIGH (ref 4.0–10.5)
nRBC: 0 % (ref 0.0–0.2)

## 2021-08-05 LAB — BASIC METABOLIC PANEL
Anion gap: 11 (ref 5–15)
BUN: 6 mg/dL (ref 6–20)
CO2: 22 mmol/L (ref 22–32)
Calcium: 9.4 mg/dL (ref 8.9–10.3)
Chloride: 103 mmol/L (ref 98–111)
Creatinine, Ser: 0.63 mg/dL (ref 0.61–1.24)
GFR, Estimated: >60 mL/min (ref >60)
Glucose, Bld: 169 mg/dL — ABNORMAL HIGH (ref 70–99)
Potassium: 3.8 mmol/L (ref 3.5–5.1)
Sodium: 136 mmol/L (ref 135–145)

## 2021-08-05 LAB — LACTIC ACID, PLASMA
Lactic Acid, Venous: 2.3 mmol/L (ref 0.5–1.9)
Lactic Acid, Venous: 2 mmol/L (ref 0.5–1.9)

## 2021-08-05 LAB — HIV ANTIBODY (ROUTINE TESTING W REFLEX): HIV Screen 4th Generation wRfx: NONREACTIVE

## 2021-08-05 SURGERY — IRRIGATION AND DEBRIDEMENT EXTREMITY
Anesthesia: Monitor Anesthesia Care | Laterality: Right

## 2021-08-05 MED ORDER — ACETAMINOPHEN 10 MG/ML IV SOLN: 1000.0000 mg | Freq: Once | INTRAVENOUS | Status: DC | PRN

## 2021-08-05 MED ORDER — ONDANSETRON HCL 4 MG/2ML IJ SOLN
4.0000 mg | Freq: Once | INTRAMUSCULAR | Status: AC
  Administered 2021-08-05: 4 mg via INTRAVENOUS
  Filled 2021-08-05: qty 2

## 2021-08-05 MED ORDER — LIDOCAINE 2% (20 MG/ML) 5 ML SYRINGE
INTRAMUSCULAR | Status: DC | PRN
  Administered 2021-08-05: 60 mg via INTRAVENOUS

## 2021-08-05 MED ORDER — MIDAZOLAM HCL 2 MG/2ML IJ SOLN
INTRAMUSCULAR | Status: AC
  Filled 2021-08-05: qty 2

## 2021-08-05 MED ORDER — MIDAZOLAM HCL 2 MG/2ML IJ SOLN
INTRAMUSCULAR | Status: DC | PRN
  Administered 2021-08-05: 2 mg via INTRAVENOUS

## 2021-08-05 MED ORDER — DEXAMETHASONE SODIUM PHOSPHATE 10 MG/ML IJ SOLN
INTRAMUSCULAR | Status: AC
  Filled 2021-08-05: qty 1

## 2021-08-05 MED ORDER — FENTANYL CITRATE (PF) 250 MCG/5ML IJ SOLN
INTRAMUSCULAR | Status: DC | PRN
  Administered 2021-08-05: 50 ug via INTRAVENOUS

## 2021-08-05 MED ORDER — FENTANYL CITRATE (PF) 100 MCG/2ML IJ SOLN
25.0000 ug | INTRAMUSCULAR | Status: DC | PRN
  Administered 2021-08-06: 50 ug via INTRAVENOUS

## 2021-08-05 MED ORDER — TRAMADOL HCL 50 MG PO TABS
50.0000 mg | ORAL_TABLET | Freq: Three times a day (TID) | ORAL | Status: DC | PRN
  Administered 2021-08-06 – 2021-08-07 (×4): 50 mg via ORAL
  Filled 2021-08-05 (×4): qty 1

## 2021-08-05 MED ORDER — ONDANSETRON HCL 4 MG/2ML IJ SOLN
INTRAMUSCULAR | Status: AC
  Filled 2021-08-05: qty 2

## 2021-08-05 MED ORDER — VANCOMYCIN HCL IN DEXTROSE 1-5 GM/200ML-% IV SOLN: 1000.0000 mg | Freq: Once | INTRAVENOUS | Status: DC

## 2021-08-05 MED ORDER — PROPOFOL 500 MG/50ML IV EMUL
INTRAVENOUS | Status: DC | PRN
  Administered 2021-08-05: 125 ug/kg/min via INTRAVENOUS

## 2021-08-05 MED ORDER — PROPOFOL 10 MG/ML IV BOLUS
INTRAVENOUS | Status: AC
  Filled 2021-08-05: qty 20

## 2021-08-05 MED ORDER — MORPHINE SULFATE (PF) 4 MG/ML IV SOLN
4.0000 mg | Freq: Once | INTRAVENOUS | Status: AC
  Administered 2021-08-05: 4 mg via INTRAVENOUS
  Filled 2021-08-05: qty 1

## 2021-08-05 MED ORDER — VANCOMYCIN HCL 1500 MG/300ML IV SOLN
1500.0000 mg | Freq: Two times a day (BID) | INTRAVENOUS | Status: DC
  Filled 2021-08-05: qty 300

## 2021-08-05 MED ORDER — LACTATED RINGERS IV SOLN: INTRAVENOUS | Status: DC | PRN

## 2021-08-05 MED ORDER — PROPOFOL 10 MG/ML IV BOLUS
INTRAVENOUS | Status: DC | PRN
  Administered 2021-08-05: 50 mg via INTRAVENOUS
  Administered 2021-08-05: 40 mg via INTRAVENOUS
  Administered 2021-08-05: 150 mg via INTRAVENOUS
  Administered 2021-08-05 (×2): 50 mg via INTRAVENOUS

## 2021-08-05 MED ORDER — SODIUM CHLORIDE 0.9 % IV SOLN
3.0000 g | Freq: Once | INTRAVENOUS | Status: AC
  Administered 2021-08-05: 3 g via INTRAVENOUS
  Filled 2021-08-05: qty 8

## 2021-08-05 MED ORDER — VANCOMYCIN HCL 1750 MG/350ML IV SOLN
1750.0000 mg | Freq: Once | INTRAVENOUS | Status: DC
  Filled 2021-08-05: qty 350

## 2021-08-05 MED ORDER — SODIUM CHLORIDE 0.9 % IV SOLN
3.0000 g | Freq: Four times a day (QID) | INTRAVENOUS | Status: DC
  Administered 2021-08-06 – 2021-08-07 (×6): 3 g via INTRAVENOUS
  Filled 2021-08-05 (×6): qty 8

## 2021-08-05 MED ORDER — ONDANSETRON HCL 4 MG/2ML IJ SOLN
INTRAMUSCULAR | Status: DC | PRN
  Administered 2021-08-05: 4 mg via INTRAVENOUS

## 2021-08-05 MED ORDER — LACTATED RINGERS IV BOLUS
1000.0000 mL | Freq: Once | INTRAVENOUS | Status: AC
  Administered 2021-08-05: 1000 mL via INTRAVENOUS

## 2021-08-05 MED ORDER — DEXMEDETOMIDINE (PRECEDEX) IN NS 20 MCG/5ML (4 MCG/ML) IV SYRINGE
PREFILLED_SYRINGE | INTRAVENOUS | Status: DC | PRN
  Administered 2021-08-05: 12 ug via INTRAVENOUS
  Administered 2021-08-05: 8 ug via INTRAVENOUS

## 2021-08-05 MED ORDER — FENTANYL CITRATE (PF) 250 MCG/5ML IJ SOLN
INTRAMUSCULAR | Status: AC
  Filled 2021-08-05: qty 5

## 2021-08-05 MED ORDER — DEXAMETHASONE SODIUM PHOSPHATE 10 MG/ML IJ SOLN
INTRAMUSCULAR | Status: DC | PRN
  Administered 2021-08-05: 5 mg via INTRAVENOUS

## 2021-08-05 MED ORDER — 0.9 % SODIUM CHLORIDE (POUR BTL) OPTIME
TOPICAL | Status: DC | PRN
  Administered 2021-08-05: 1000 mL

## 2021-08-05 MED ORDER — LIDOCAINE 2% (20 MG/ML) 5 ML SYRINGE
INTRAMUSCULAR | Status: AC
  Filled 2021-08-05: qty 5

## 2021-08-05 SURGICAL SUPPLY — 51 items
BNDG CONFORM 2 STRL LF (GAUZE/BANDAGES/DRESSINGS) IMPLANT
BNDG ELASTIC 2X5.8 VLCR STR LF (GAUZE/BANDAGES/DRESSINGS) ×2 IMPLANT
BNDG ELASTIC 3X5.8 VLCR STR LF (GAUZE/BANDAGES/DRESSINGS) ×2 IMPLANT
BNDG ELASTIC 4X5.8 VLCR STR LF (GAUZE/BANDAGES/DRESSINGS) ×2 IMPLANT
BNDG ESMARK 4X9 LF (GAUZE/BANDAGES/DRESSINGS) IMPLANT
BNDG GAUZE ELAST 4 BULKY (GAUZE/BANDAGES/DRESSINGS) ×2 IMPLANT
BNDG STRETCH 4X75 NS LF (GAUZE/BANDAGES/DRESSINGS) ×1 IMPLANT
CHLORAPREP W/TINT 26 (MISCELLANEOUS) ×2 IMPLANT
CORD BIPOLAR FORCEPS 12FT (ELECTRODE) ×2 IMPLANT
COVER BACK TABLE 60X90IN (DRAPES) IMPLANT
COVER MAYO STAND STRL (DRAPES) ×2 IMPLANT
COVER SURGICAL LIGHT HANDLE (MISCELLANEOUS) ×2 IMPLANT
CUFF TOURN SGL QUICK 18X4 (TOURNIQUET CUFF) ×2 IMPLANT
DRAIN PENROSE 0.25X18 (DRAIN) ×1 IMPLANT
DRAIN PENROSE 18X1/4 LTX STRL (DRAIN) IMPLANT
DRAPE HALF SHEET 40X57 (DRAPES) ×2 IMPLANT
DRAPE OEC MINIVIEW 54X84 (DRAPES) IMPLANT
DRAPE SURG 17X23 STRL (DRAPES) ×2 IMPLANT
DRSG ADAPTIC 3X8 NADH LF (GAUZE/BANDAGES/DRESSINGS) ×1 IMPLANT
DRSG XEROFORM 1X8 (GAUZE/BANDAGES/DRESSINGS) ×1 IMPLANT
GAUZE SPONGE 4X4 12PLY STRL (GAUZE/BANDAGES/DRESSINGS) ×2 IMPLANT
GAUZE XEROFORM 1X8 LF (GAUZE/BANDAGES/DRESSINGS) ×1 IMPLANT
GLOVE BIO SURGEON STRL SZ7 (GLOVE) ×2 IMPLANT
GLOVE SURG UNDER POLY LF SZ7 (GLOVE) ×2 IMPLANT
GOWN STRL REUS W/ TWL XL LVL3 (GOWN DISPOSABLE) ×2 IMPLANT
GOWN STRL REUS W/TWL XL LVL3 (GOWN DISPOSABLE) ×2
IV CATH 18G X1.75 CATHLON (IV SOLUTION) IMPLANT
KIT BASIN OR (CUSTOM PROCEDURE TRAY) ×2 IMPLANT
KIT TURNOVER KIT B (KITS) ×2 IMPLANT
NDL KEITH (NEEDLE) IMPLANT
NEEDLE KEITH (NEEDLE) IMPLANT
NS IRRIG 1000ML POUR BTL (IV SOLUTION) ×2 IMPLANT
PACK ORTHO EXTREMITY (CUSTOM PROCEDURE TRAY) ×2 IMPLANT
PAD ABD 8X10 STRL (GAUZE/BANDAGES/DRESSINGS) ×1 IMPLANT
PAD ARMBOARD 7.5X6 YLW CONV (MISCELLANEOUS) ×2 IMPLANT
PAD CAST 4YDX4 CTTN HI CHSV (CAST SUPPLIES) ×2 IMPLANT
PADDING CAST ABS 3INX4YD NS (CAST SUPPLIES)
PADDING CAST ABS COTTON 3X4 (CAST SUPPLIES) ×1 IMPLANT
PADDING CAST COTTON 4X4 STRL (CAST SUPPLIES)
SPLINT PLASTER CAST XFAST 3X15 (CAST SUPPLIES) ×1 IMPLANT
SPLINT PLASTER XTRA FASTSET 3X (CAST SUPPLIES) ×1
SPONGE T-LAP 4X18 ~~LOC~~+RFID (SPONGE) ×1 IMPLANT
SUT ETHILON 4 0 PS 2 18 (SUTURE) ×2 IMPLANT
SWAB CULTURE ESWAB REG 1ML (MISCELLANEOUS) ×1 IMPLANT
SYR BULB EAR ULCER 3OZ GRN STR (SYRINGE) ×1 IMPLANT
SYR CONTROL 10ML LL (SYRINGE) IMPLANT
TOWEL GREEN STERILE (TOWEL DISPOSABLE) ×2 IMPLANT
TOWEL GREEN STERILE FF (TOWEL DISPOSABLE) ×4 IMPLANT
TUBE CONNECTING 12X1/4 (SUCTIONS) IMPLANT
UNDERPAD 30X36 HEAVY ABSORB (UNDERPADS AND DIAPERS) ×2 IMPLANT
YANKAUER SUCT BULB TIP NO VENT (SUCTIONS) IMPLANT

## 2021-08-05 NOTE — Brief Op Note (Signed)
08/05/2021  10:53 PM  PATIENT:  Garrett West  26 y.o. male  PRE-OPERATIVE DIAGNOSIS:  RIGHT INDEX FINGER WOUND  POST-OPERATIVE DIAGNOSIS:  RIGHT INDEX FINGER WOUND  PROCEDURE:  Procedure(s): IRRIGATION AND DEBRIDEMENT RIGHT INDEX FINGER (Right)  SURGEON:  Surgeon(s) and Role:    * Marlyne Beards, MD - Primary  PHYSICIAN ASSISTANT:   ASSISTANTS: none   ANESTHESIA:   general  EBL:  5 mL   BLOOD ADMINISTERED:none  DRAINS: Penrose drain in the index finger    LOCAL MEDICATIONS USED:  NONE  SPECIMEN:  No Specimen  DISPOSITION OF SPECIMEN:   Microbiology  COUNTS:  YES  TOURNIQUET:   Total Tourniquet Time Documented: Forearm (Right) - 21 minutes Total: Forearm (Right) - 21 minutes   DICTATION: .Dragon Dictation  PLAN OF CARE: Discharge to home after PACU  PATIENT DISPOSITION:  PACU - hemodynamically stable.   Delay start of Pharmacological VTE agent (>24hrs) due to surgical blood loss or risk of bleeding: not applicable

## 2021-08-05 NOTE — ED Provider Notes (Signed)
[facility] EMERGENCY DEPARTMENT Provider Note   CSN: [id number] Arrival date & time: 08/05/21  1258     History  Chief Complaint  Patient presents with   Cellulitis    Garrett West is a 26 y.o. male.  Patient as above with significant medical history as below, including no significant medical history who presents to the ED with complaint of right index finger injury.  Patient is RHD.  Reports Friday he had a rose thorn stuck in his finger.  He removed the thorn.  2 days ago he noticed swelling, discomfort, pain to his distal aspect of his right index finger.  Low-grade fever earlier today.  No medications prior to arrival.  He has noticed some purulent drainage from the wound in his finger.  There is minimal discomfort with flexion of the finger.  No prior surgical history.  Last p.o. intake was around 11 this morning; is not sure what he ate at that time.     History reviewed. No pertinent past medical history.  History reviewed. No pertinent surgical history.   The history is provided by the patient. No language interpreter was used.       Home Medications Prior to Admission medications   Not on File      Allergies    Patient has no known allergies.    Review of Systems   Review of Systems  Constitutional:  Positive for fever. Negative for chills.  HENT:  Negative for facial swelling and trouble swallowing.   Eyes:  Negative for photophobia and visual disturbance.  Respiratory:  Negative for cough and shortness of breath.   Cardiovascular:  Negative for chest pain and palpitations.  Gastrointestinal:  Negative for abdominal pain, nausea and vomiting.  Endocrine: Negative for polydipsia and polyuria.  Genitourinary:  Negative for difficulty urinating and hematuria.  Musculoskeletal:  Negative for gait problem and joint swelling.  Skin:  Positive for wound. Negative for pallor and rash.  Neurological:  Negative for syncope and headaches.   Psychiatric/Behavioral:  Negative for agitation and confusion.     Physical Exam Updated Vital Signs BP (!) 142/88 (BP Location: Left Arm)   Pulse (!) 108   Temp 98.1 F (36.7 C) (Oral)   Resp 16   Ht 5\' 5"  (1.651 m)   Wt 80 kg   SpO2 96%   BMI 29.35 kg/m  Physical Exam Vitals and nursing note reviewed.  Constitutional:      General: He is not in acute distress.    Appearance: Normal appearance. He is well-developed. He is not ill-appearing or diaphoretic.  HENT:     Head: Normocephalic and atraumatic.     Right Ear: External ear normal.     Left Ear: External ear normal.     Mouth/Throat:     Mouth: Mucous membranes are moist.  Eyes:     General: No scleral icterus. Cardiovascular:     Rate and Rhythm: Normal rate and regular rhythm.     Pulses: Normal pulses.     Heart sounds: Normal heart sounds.  Pulmonary:     Effort: Pulmonary effort is normal. No respiratory distress.     Breath sounds: Normal breath sounds.  Abdominal:     General: Abdomen is flat.     Palpations: Abdomen is soft.     Tenderness: There is no abdominal tenderness.  Musculoskeletal:        General: Swelling present.     Cervical back: Normal range of motion.  Right lower leg: No edema.     Left lower leg: No edema.     Comments: See photo, cellulitis to right second digit.  Finger is held in passive flexion, there is no significant pain along the Flexeril tendon sheath, no pain with passive extension, mild pain with passive flexion. Radial pulses are intact.  Skin:    General: Skin is warm and dry.     Capillary Refill: Capillary refill takes less than 2 seconds.  Neurological:     Mental Status: He is alert and oriented to person, place, and time.  Psychiatric:        Mood and Affect: Mood normal.        Behavior: Behavior normal.        ED Results / Procedures / Treatments   Labs (all labs ordered are listed, but only abnormal results are displayed) Labs Reviewed  CBC WITH  DIFFERENTIAL/PLATELET - Abnormal; Notable for the following components:      Result Value   WBC 13.4 (*)    Neutro Abs 10.3 (*)    Monocytes Absolute 1.3 (*)    All other components within normal limits  BASIC METABOLIC PANEL - Abnormal; Notable for the following components:   Glucose, Bld 169 (*)    All other components within normal limits  LACTIC ACID, PLASMA - Abnormal; Notable for the following components:   Lactic Acid, Venous 2.3 (*)    All other components within normal limits  LACTIC ACID, PLASMA - Abnormal; Notable for the following components:   Lactic Acid, Venous 2.0 (*)    All other components within normal limits  CULTURE, BLOOD (ROUTINE X 2)  CULTURE, BLOOD (ROUTINE X 2)  AEROBIC CULTURE W GRAM STAIN (SUPERFICIAL SPECIMEN)  ANAEROBIC CULTURE W GRAM STAIN  HIV ANTIBODY (ROUTINE TESTING W REFLEX)  HEMOGLOBIN A1C    EKG None  Radiology DG Finger Index Right  Result Date: 08/05/2021 CLINICAL DATA:  Right index finger infection the past 2-3 days following an injury from a thorn. EXAM: RIGHT INDEX FINGER 2+V COMPARISON:  None Available. FINDINGS: There is diffuse soft tissue swelling about the second digit without associated radiopaque foreign body or subcutaneous emphysema. No discrete areas of osteolysis to suggest osteomyelitis. No fracture or dislocation. Joint spaces are preserved. No erosions. IMPRESSION: Diffuse soft tissue swelling about the index finger without radiopaque foreign body or radiographic evidence of osteomyelitis. Electronically Signed   By: Simonne Come M.D.   On: 08/05/2021 16:33    Procedures .Critical Care  Performed by: Sloan Leiter, DO Authorized by: Sloan Leiter, DO   Critical care provider statement:    Critical care time (minutes):  30   Critical care time was exclusive of:  Separately billable procedures and treating other patients   Critical care was necessary to treat or prevent imminent or life-threatening deterioration of the  following conditions:  Sepsis   Critical care was time spent personally by me on the following activities:  Development of treatment plan with patient or surrogate, discussions with consultants, evaluation of patient's response to treatment, examination of patient, ordering and review of laboratory studies, ordering and review of radiographic studies, ordering and performing treatments and interventions, pulse oximetry, re-evaluation of patient's condition, review of old charts and obtaining history from patient or surrogate   Care discussed with: admitting provider   Comments:     Pt to OR     Medications Ordered in ED Medications  traMADol (ULTRAM) tablet 50 mg ( Oral  MAR Hold 08/05/21 2204)  vancomycin (VANCOREADY) IVPB 1750 mg/350 mL ( Intravenous Automatically Held 08/05/21 2215)    Followed by  vancomycin (VANCOREADY) IVPB 1500 mg/300 mL ( Intravenous Automatically Held 08/14/21 2215)  Ampicillin-Sulbactam (UNASYN) 3 g in sodium chloride 0.9 % 100 mL IVPB ( Intravenous Automatically Held 08/14/21 1800)  Ampicillin-Sulbactam (UNASYN) 3 g in sodium chloride 0.9 % 100 mL IVPB (0 g Intravenous Stopped 08/05/21 1839)  lactated ringers bolus 1,000 mL (1,000 mLs Intravenous Bolus 08/05/21 1737)  morphine (PF) 4 MG/ML injection 4 mg (4 mg Intravenous Given 08/05/21 1747)  ondansetron (ZOFRAN) injection 4 mg (4 mg Intravenous Given 08/05/21 1745)    ED Course/ Medical Decision Making/ A&P                           Medical Decision Making Risk Prescription drug management. Decision regarding hospitalization.    CC: Finger wound  This patient presents to the Emergency Department for the above complaint. This involves an extensive number of treatment options and is a complaint that carries with it a high risk of complications and morbidity. Vital signs were reviewed. Serious etiologies considered.  Dx includes not limited to cellulitis, flexor tenosynovitis, osteomyelitis, necrotizing fasciitis,  deep space infection, sporothrix, other etiologies considered.  Record review:  Previous records obtained and reviewed prior admission, prior ED visit  Additional history obtained from N/A  Medical and surgical history as noted above.   Work up as above, notable for:  Labs & imaging results that were available during my care of the patient were visualized by me and considered in my medical decision making.  Physical exam as above.   imaging studies ordered in triage which included an x-ray. I visualized the imaging, interpreted images, and I agree with radiologist interpretation.  Soft tissue swelling  Cardiac monitoring reviewed and interpreted personally which shows sinus tachycardia  Labs reviewed, WBC 13.4, lactic acid 2.3. Send blood cultures, start IV fluids, start broad-spectrum antibiotics Patient does have significant soft tissue swelling to his index finger.  Possibly developing flexor tenosynovitis.  There is no severe pain with palpation the flexor tendon sheath nor is there significant pain with extension.  The finger is held in passive flexion he does have fusiform swelling.  Will discuss with hand surgery.  Personally discussed patient care with consultant; Dr Frazier Butt; will plan for OR tonight (I/D)  Management: See above  ED Course:     Reassessment:  Symptoms improved  Admission was considered.    Plan admission to hospitalist service, discussed with Dr. Jerral Ralph who accepts pt for admission.             Social determinants of health include -  Social History   Socioeconomic History   Marital status: Single    Spouse name: Not on file   Number of children: Not on file   Years of education: Not on file   Highest education level: Not on file  Occupational History   Not on file  Tobacco Use   Smoking status: Never   Smokeless tobacco: Never  Substance and Sexual Activity   Alcohol use: Not Currently   Drug use: Yes    Types:  Methamphetamines, Marijuana    Comment: daily meth use   Sexual activity: Not on file  Other Topics Concern   Not on file  Social History Narrative   Not on file   Social Determinants of Health   Financial Resource Strain: Not  on file  Food Insecurity: Not on file  Transportation Needs: Not on file  Physical Activity: Not on file  Stress: Not on file  Social Connections: Not on file  Intimate Partner Violence: Not on file      This chart was dictated using voice recognition software.  Despite best efforts to proofread,  errors can occur which can change the documentation meaning.         Final Clinical Impression(s) / ED Diagnoses Final diagnoses:  Cellulitis of finger of right hand    Rx / DC Orders ED Discharge Orders     None         Jeanell Sparrow, DO 08/05/21 2302

## 2021-08-05 NOTE — Consult Note (Signed)
HAND SURGERY CONSULTATION  REQUESTING PHYSICIAN: Dorcas Carrow, MD  Chief Complaint: Right index finger infection  HPI: Ranferi Patrica Duel is a 26 y.o. male w/ PMH of daily methamphetamine use who presents with pain, swelling, and drainage from dorsal and distal aspect of right index finger.  Pt notes that he thinks his got stuck by a thorn in this finger 5 days ago.  He describes pain in the distal aspect of the index finger that has progressively worsened.  He denies pain along the volar aspect of the finger.  He denies pain in the hand.   Hand dominance: Right Occupation: Currently unemployed, previously Holiday representative  History reviewed. No pertinent past medical history. History reviewed. No pertinent surgical history. Social History   Socioeconomic History   Marital status: Single    Spouse name: Not on file   Number of children: Not on file   Years of education: Not on file   Highest education level: Not on file  Occupational History   Not on file  Tobacco Use   Smoking status: Never   Smokeless tobacco: Never  Substance and Sexual Activity   Alcohol use: Not Currently   Drug use: Yes    Types: Methamphetamines, Marijuana    Comment: daily meth use   Sexual activity: Not on file  Other Topics Concern   Not on file  Social History Narrative   Not on file   Social Determinants of Health   Financial Resource Strain: Not on file  Food Insecurity: Not on file  Transportation Needs: Not on file  Physical Activity: Not on file  Stress: Not on file  Social Connections: Not on file   History reviewed. No pertinent family history. - negative except otherwise stated in the family history section No Known Allergies Prior to Admission medications   Not on File   DG Finger Index Right  Result Date: 08/05/2021 CLINICAL DATA:  Right index finger infection the past 2-3 days following an injury from a thorn. EXAM: RIGHT INDEX FINGER 2+V COMPARISON:  None Available.  FINDINGS: There is diffuse soft tissue swelling about the second digit without associated radiopaque foreign body or subcutaneous emphysema. No discrete areas of osteolysis to suggest osteomyelitis. No fracture or dislocation. Joint spaces are preserved. No erosions. IMPRESSION: Diffuse soft tissue swelling about the index finger without radiopaque foreign body or radiographic evidence of osteomyelitis. Electronically Signed   By: Simonne Come M.D.   On: 08/05/2021 16:33     Positive ROS: All other systems have been reviewed and were otherwise negative with the exception of those mentioned in the HPI and as above.  Physical Exam: General: No acute distress, resting comfortably Cardiovascular: No pedal edema Respiratory: No cyanosis, no use of accessory musculature Skin: No lesions in the area of chief complaint Neurologic: Sensation intact distally Psychiatric: Patient is at baseline mood and affect  Right Hand: Diffuse swelling of index finger Open wound at dorsal and distal aspect of finger around DIPJ with associated TTP and drainage No TTP along flexor tendon sheath Finger held in extension No TTP in remainder of hand   Assessment: 26 yo RHD M w/ right index finger infection.  Clinical exam consistent with dorsal abscess.  No concern for flexor tenosynovitis.   Plan: OR tonight for I&D Discussed risks of surgery with patient including bleeding, infection, damage to neurovascular structures, incomplete symptom relief, persistent infection, need for additional surgeries. Pt admitted to hospitalist service, appreciate their assistance Will obtain intraoperative cultures for abx  choice  Thank you for the consult and the opportunity to see Mr. Garcia Ramirez  Magalie Almon Arienne Gartin, M.D. [facility] 10:02 PM      

## 2021-08-05 NOTE — H&P (Signed)
History and Physical    Garrett West GNF:[id number] DOB: 02-20-1995 DOA: 08/05/2021  PCP: Pcp, No  Patient coming from: Home  I have personally briefly reviewed patient's old medical records available.   Chief Complaint: Right index finger injury, swelling and tenderness for 5 days.  HPI: Garrett West is a 26 y.o. male with medical history significant of IV drug use, polysubstance abuse, history of stroke, currently not on any maintenance medications presents to the emergency room with 5 days of pain and swelling of the right index finger.  He was pricked by a rose thorn in his index finger which he removed but subsequently started noticing more swelling discomfort and pain.  Reports low-grade fever at home.  Has not taken any medications.  Now it is purulent drainage.  Unable to flex the distal interphalangeal joint. Pain 8/10 at times but currently improved. ED Course: Hemodynamically stable.  Tachycardic with heart rate 117.  WC count 13.4.  Lactic acid 2.3.  Serum glucose 169.  X-ray with soft tissue swelling but intact bones. Given Unasyn and vancomycin, blood cultures were collected, hand surgery was consulted who recommended IV antibiotics monitoring and taking to the OR tonight for debridement.  Review of Systems: all systems are reviewed and pertinent positive as per HPI otherwise rest are negative.    History reviewed. No pertinent past medical history.  History reviewed. No pertinent surgical history.  Social history   reports that he has never smoked. He has never used smokeless tobacco. He reports that he does not currently use alcohol. He reports current drug use. Drugs: Methamphetamines and Marijuana. Denies injectable drug use.  He does smoke methamphetamine and marijuana. No Known Allergies  History reviewed. No pertinent family history.   Prior to Admission medications   Not on File  Not on any medications.  Physical Exam: Vitals:   08/05/21  1321 08/05/21 1321 08/05/21 1407  BP:  (!) 138/93   Pulse:  (!) 117   Resp: 16    Temp:  98.8 F (37.1 C)   TempSrc:  Oral   SpO2:  99%   Weight:   80 kg  Height:   5\' 5"  (1.651 m)    Constitutional: NAD, calm, comfortable Vitals:   08/05/21 1321 08/05/21 1321 08/05/21 1407  BP:  (!) 138/93   Pulse:  (!) 117   Resp: 16    Temp:  98.8 F (37.1 C)   TempSrc:  Oral   SpO2:  99%   Weight:   80 kg  Height:   5\' 5"  (1.651 m)   Eyes: PERRL, lids and conjunctivae normal ENMT: Mucous membranes are moist. Posterior pharynx clear of any exudate or lesions.Normal dentition.  Neck: normal, supple, no masses, no thyromegaly Respiratory: clear to auscultation bilaterally, no wheezing, no crackles. Normal respiratory effort. No accessory muscle use.  Cardiovascular: Regular rate and rhythm, no murmurs / rubs / gallops. No extremity edema. 2+ pedal pulses. No carotid bruits.  Abdomen: no tenderness, no masses palpated. No hepatosplenomegaly. Bowel sounds positive.  Skin: no rashes, lesions, ulcers. No induration Neurologic: CN 2-12 grossly intact. Sensation intact, DTR normal. Strength 5/5 in all 4.  Psychiatric: Normal judgment and insight. Alert and oriented x 3. Normal mood.  Musculoskeletal: no clubbing / cyanosis. No joint deformity upper and lower extremities. Good ROM, no contractures. Normal muscle tone.  Right index finger with swelling, grossly erythematous and swelling extending to the base of the finger.  No other joint involvement.  Labs on Admission: I have personally reviewed following labs and imaging studies  CBC: Recent Labs  Lab 08/05/21 1423  WBC 13.4*  NEUTROABS 10.3*  HGB 15.2  HCT 45.6  MCV 86.7  PLT 295   Basic Metabolic Panel: Recent Labs  Lab 08/05/21 1423  NA 136  K 3.8  CL 103  CO2 22  GLUCOSE 169*  BUN 6  CREATININE 0.63  CALCIUM 9.4   GFR: Estimated Creatinine Clearance: 138.8 mL/min (by C-G formula based on SCr of 0.63 mg/dL). Liver  Function Tests: No results for input(s): "AST", "ALT", "ALKPHOS", "BILITOT", "PROT", "ALBUMIN" in the last 168 hours. No results for input(s): "LIPASE", "AMYLASE" in the last 168 hours. No results for input(s): "AMMONIA" in the last 168 hours. Coagulation Profile: No results for input(s): "INR", "PROTIME" in the last 168 hours. Cardiac Enzymes: No results for input(s): "CKTOTAL", "CKMB", "CKMBINDEX", "TROPONINI" in the last 168 hours. BNP (last 3 results) No results for input(s): "PROBNP" in the last 8760 hours. HbA1C: No results for input(s): "HGBA1C" in the last 72 hours. CBG: No results for input(s): "GLUCAP" in the last 168 hours. Lipid Profile: No results for input(s): "CHOL", "HDL", "LDLCALC", "TRIG", "CHOLHDL", "LDLDIRECT" in the last 72 hours. Thyroid Function Tests: No results for input(s): "TSH", "T4TOTAL", "FREET4", "T3FREE", "THYROIDAB" in the last 72 hours. Anemia Panel: No results for input(s): "VITAMINB12", "FOLATE", "FERRITIN", "TIBC", "IRON", "RETICCTPCT" in the last 72 hours. Urine analysis:    Component Value Date/Time   COLORURINE YELLOW 05/29/2020 1209   APPEARANCEUR CLEAR 05/29/2020 1209   LABSPEC 1.016 05/29/2020 1209   PHURINE 6.0 05/29/2020 1209   GLUCOSEU >=500 (A) 05/29/2020 1209   HGBUR MODERATE (A) 05/29/2020 1209   BILIRUBINUR NEGATIVE 05/29/2020 1209   KETONESUR NEGATIVE 05/29/2020 1209   PROTEINUR 30 (A) 05/29/2020 1209   NITRITE NEGATIVE 05/29/2020 1209   LEUKOCYTESUR NEGATIVE 05/29/2020 1209    Radiological Exams on Admission: DG Finger Index Right  Result Date: 08/05/2021 CLINICAL DATA:  Right index finger infection the past 2-3 days following an injury from a thorn. EXAM: RIGHT INDEX FINGER 2+V COMPARISON:  None Available. FINDINGS: There is diffuse soft tissue swelling about the second digit without associated radiopaque foreign body or subcutaneous emphysema. No discrete areas of osteolysis to suggest osteomyelitis. No fracture or  dislocation. Joint spaces are preserved. No erosions. IMPRESSION: Diffuse soft tissue swelling about the index finger without radiopaque foreign body or radiographic evidence of osteomyelitis. Electronically Signed   By: Simonne Come M.D.   On: 08/05/2021 16:33     Assessment/Plan Principal Problem:   Injury, superficial, finger with infection     1.  Right index finger abscess and cellulitis, tenosynovitis: Blood cultures.  Deep tissue cultures will be appreciated during surgery.  I&D tonight by hand surgery. Continue Unasyn and vancomycin.  Adequate pain medications.   Postop management after Surgery. We will check hemoglobin A1c with elevated random blood sugars.  2.  Polysubstance abuse: Patient uses methamphetamine and marijuana.  He smokes it.  We discussed about rehab.  Patient tells me that he is already in the process to check into rehab by end of this week and appreciate our counseling. Currently remains stable and does not have any evidence of withdrawal.  Will need close monitoring.   DVT prophylaxis: Ambulatory. Code Status: Full code Family Communication: None Disposition Plan: Home Consults called: Hand surgery Admission status: MedSurg, observation   Dorcas Carrow MD [facility] Pager [phone]

## 2021-08-05 NOTE — Transfer of Care (Signed)
Immediate Anesthesia Transfer of Care Note  Patient: Garrett West  Procedure(s) Performed: IRRIGATION AND DEBRIDEMENT RIGHT INDEX FINGER (Right)  Patient Location: PACU  Anesthesia Type:General  Level of Consciousness: awake and alert   Airway & Oxygen Therapy: Patient Spontanous Breathing and Patient connected to face mask oxygen  Post-op Assessment: Report given to RN and Post -op Vital signs reviewed and stable  Post vital signs: Reviewed and stable  Last Vitals:  Vitals Value Taken Time  BP 118/76 08/05/21 2309  Temp    Pulse 103 08/05/21 2309  Resp 17 08/05/21 2309  SpO2 96 % 08/05/21 2309  Vitals shown include unvalidated device data.  Last Pain:  Vitals:   08/05/21 1924  TempSrc: Oral  PainSc: 10-Worst pain ever         Complications: No notable events documented.

## 2021-08-05 NOTE — Anesthesia Preprocedure Evaluation (Addendum)
Anesthesia Evaluation  Patient identified by MRN, date of birth, ID band Patient awake    Reviewed: Allergy & Precautions, NPO status , Patient's Chart, lab work & pertinent test results  Airway Mallampati: II  TM Distance: >3 FB Neck ROM: Full    Dental no notable dental hx.    Pulmonary neg pulmonary ROS,    Pulmonary exam normal        Cardiovascular negative cardio ROS   Rhythm:Regular Rate:Normal     Neuro/Psych CVA, No Residual Symptoms negative psych ROS   GI/Hepatic negative GI ROS, (+)     substance abuse  methamphetamine use and IV drug use,   Endo/Other  negative endocrine ROS  Renal/GU negative Renal ROS  negative genitourinary   Musculoskeletal Finger infection    Abdominal Normal abdominal exam  (+)   Peds  Hematology negative hematology ROS (+)   Anesthesia Other Findings   Reproductive/Obstetrics                            Anesthesia Physical Anesthesia Plan  ASA: 3  Anesthesia Plan: MAC   Post-op Pain Management:    Induction: Intravenous  PONV Risk Score and Plan: 1 and Propofol infusion, Treatment may vary due to age or medical condition, Ondansetron, Dexamethasone and Midazolam  Airway Management Planned: Simple Face Mask, Natural Airway and Nasal Cannula  Additional Equipment: None  Intra-op Plan:   Post-operative Plan:   Informed Consent: I have reviewed the patients History and Physical, chart, labs and discussed the procedure including the risks, benefits and alternatives for the proposed anesthesia with the patient or authorized representative who has indicated his/her understanding and acceptance.     Dental advisory given  Plan Discussed with: CRNA  Anesthesia Plan Comments:        Anesthesia Quick Evaluation

## 2021-08-05 NOTE — Interval H&P Note (Signed)
History and Physical Interval Note:  08/05/2021 10:06 PM  Garrett West  has presented today for surgery, with the diagnosis of RIGHT INDEX FINGER WOUND.  The various methods of treatment have been discussed with the patient and family. After consideration of risks, benefits and other options for treatment, the patient has consented to  Procedure(s): IRRIGATION AND DEBRIDEMENT RIGHT INDEX FINGER (Right) as a surgical intervention.  The patient's history has been reviewed, patient examined, no change in status, stable for surgery.  I have reviewed the patient's chart and labs.  Questions were answered to the patient's satisfaction.     Annagrace Carr Ronnita Paz

## 2021-08-05 NOTE — ED Provider Triage Note (Signed)
Emergency Medicine Provider Triage Evaluation Note  Supreme Patrica Duel , a 26 y.o. male  was evaluated in triage.  Pt complains of right index finger infection. States that 3 days ago he thought there was a thorn stuck in his finger and he kept trying to mess with it to get the thorn out. States it subsequently started swelling and today has been draining purulence. No fevers or chills. No hx of diabetes  Review of Systems  Positive:  Negative:   Physical Exam  BP (!) 138/93   Pulse (!) 117   Temp 98.8 F (37.1 C) (Oral)   Resp 16   Ht 5\' 5"  (1.651 m)   Wt 80 kg   SpO2 99%   BMI 29.35 kg/m  Gen:   Awake, no distress   Resp:  Normal effort  MSK:   Moves extremities without difficulty  Other:  Markedly swollen and red right index finger.  Capillary refill less than 2 seconds.  See images below for further.       Medical Decision Making  Medically screening exam initiated at 2:22 PM.  Appropriate orders placed.  Jashad was informed that the remainder of the evaluation will be completed by another provider, this initial triage assessment does not replace that evaluation, and the importance of remaining in the ED until their evaluation is complete.     Patrica Duel, PA-C 08/05/21 1424

## 2021-08-05 NOTE — ED Notes (Signed)
Pt is getting undressed, OR unable to take pt until 2000 pt last had something by mouth at noon

## 2021-08-05 NOTE — Anesthesia Procedure Notes (Signed)
Procedure Name: LMA Insertion Date/Time: 08/05/2021 10:31 PM  Performed by: Tressia Miners, CRNAPre-anesthesia Checklist: Patient identified, Emergency Drugs available, Suction available and Patient being monitored Patient Re-evaluated:Patient Re-evaluated prior to induction Oxygen Delivery Method: Circle System Utilized Preoxygenation: Pre-oxygenation with 100% oxygen Induction Type: IV induction Ventilation: Mask ventilation without difficulty LMA: LMA inserted LMA Size: 4.0 Number of attempts: 1 Airway Equipment and Method: Bite block Placement Confirmation: positive ETCO2 Tube secured with: Tape Dental Injury: Teeth and Oropharynx as per pre-operative assessment

## 2021-08-05 NOTE — ED Triage Notes (Signed)
Pt arrives POV for eval of R pointer finger infection. Pt reports 3-4 days PTA he attempted to cut out a thorn that was stuck in there and has noted that over the last 2-3 days it has become increasingly red,swollen, tender. Finger is obviously swollen and reddened in triage.

## 2021-08-05 NOTE — Progress Notes (Signed)
Called report to [facility]. Patient is being transported to the OR. All personal belongings have been left in patient's room.

## 2021-08-05 NOTE — Progress Notes (Signed)
Pharmacy Antibiotic Note  Garrett West is a 26 y.o. male for which pharmacy has been consulted for Unasyn and vancomycin dosing for cellulitis.  Patient with a history of drug use, polysubstance abuse, history of stroke. Patient presenting with 5 days of pain and swelling of the right index finger.  SCr 0.63 - at baseline WBC 13.4; LA 2.3>2; T 98.8 F; HR 117; RR 16  Plan: Unasyn 3g q6h Vancomycin 1750 mg once then 1500 mg q12hr (eAUC 485.9) unless change in renal function Trend WBC, Fever, Renal function, & Clinical course F/u cultures, clinical course, WBC, fever De-escalate when able  Height: 5\' 5"  (165.1 cm) Weight: 80 kg (176 lb 5.9 oz) IBW/kg (Calculated) : 61.5  Temp (24hrs), Avg:98.8 F (37.1 C), Min:98.8 F (37.1 C), Max:98.8 F (37.1 C)  Recent Labs  Lab 08/05/21 1423  WBC 13.4*  CREATININE 0.63  LATICACIDVEN 2.3*    Estimated Creatinine Clearance: 138.8 mL/min (by C-G formula based on SCr of 0.63 mg/dL).    No Known Allergies  Antimicrobials this admission: unasyn 7/26 >>  vancomycin 7/26 >>  Microbiology results: Pending  Thank you for allowing pharmacy to be a part of this patient's care.  8/26, PharmD, BCPS 08/05/2021 4:56 PM ED Clinical Pharmacist -  [phone]

## 2021-08-05 NOTE — H&P (View-Only) (Signed)
HAND SURGERY CONSULTATION  REQUESTING PHYSICIAN: Garrett Carrow, MD  Chief Complaint: Right index finger infection  HPI: Garrett West is a 26 y.o. male w/ PMH of daily methamphetamine use who presents with pain, swelling, and drainage from dorsal and distal aspect of right index finger.  Pt notes that he thinks his got stuck by a thorn in this finger 5 days ago.  He describes pain in the distal aspect of the index finger that has progressively worsened.  He denies pain along the volar aspect of the finger.  He denies pain in the hand.   Hand dominance: Right Occupation: Currently unemployed, previously Holiday representative  History reviewed. No pertinent past medical history. History reviewed. No pertinent surgical history. Social History   Socioeconomic History   Marital status: Single    Spouse name: Not on file   Number of children: Not on file   Years of education: Not on file   Highest education level: Not on file  Occupational History   Not on file  Tobacco Use   Smoking status: Never   Smokeless tobacco: Never  Substance and Sexual Activity   Alcohol use: Not Currently   Drug use: Yes    Types: Methamphetamines, Marijuana    Comment: daily meth use   Sexual activity: Not on file  Other Topics Concern   Not on file  Social History Narrative   Not on file   Social Determinants of Health   Financial Resource Strain: Not on file  Food Insecurity: Not on file  Transportation Needs: Not on file  Physical Activity: Not on file  Stress: Not on file  Social Connections: Not on file   History reviewed. No pertinent family history. - negative except otherwise stated in the family history section No Known Allergies Prior to Admission medications   Not on File   DG Finger Index Right  Result Date: 08/05/2021 CLINICAL DATA:  Right index finger infection the past 2-3 days following an injury from a thorn. EXAM: RIGHT INDEX FINGER 2+V COMPARISON:  None Available.  FINDINGS: There is diffuse soft tissue swelling about the second digit without associated radiopaque foreign body or subcutaneous emphysema. No discrete areas of osteolysis to suggest osteomyelitis. No fracture or dislocation. Joint spaces are preserved. No erosions. IMPRESSION: Diffuse soft tissue swelling about the index finger without radiopaque foreign body or radiographic evidence of osteomyelitis. Electronically Signed   By: Garrett West M.D.   On: 08/05/2021 16:33     Positive ROS: All other systems have been reviewed and were otherwise negative with the exception of those mentioned in the HPI and as above.  Physical Exam: General: No acute distress, resting comfortably Cardiovascular: No pedal edema Respiratory: No cyanosis, no use of accessory musculature Skin: No lesions in the area of chief complaint Neurologic: Sensation intact distally Psychiatric: Patient is at baseline mood and affect  Right Hand: Diffuse swelling of index finger Open wound at dorsal and distal aspect of finger around DIPJ with associated TTP and drainage No TTP along flexor tendon sheath Finger held in extension No TTP in remainder of hand   Assessment: 26 yo RHD M w/ right index finger infection.  Clinical exam consistent with dorsal abscess.  No concern for flexor tenosynovitis.   Plan: OR tonight for I&D Discussed risks of surgery with patient including bleeding, infection, damage to neurovascular structures, incomplete symptom relief, persistent infection, need for additional surgeries. Pt admitted to hospitalist service, appreciate their assistance Will obtain intraoperative cultures for abx  choice  Thank you for the consult and the opportunity to see Mr. Garrett West  Garrett West, M.D. [facility] 10:02 PM

## 2021-08-06 ENCOUNTER — Encounter (HOSPITAL_COMMUNITY): Payer: Self-pay | Admitting: Orthopedic Surgery

## 2021-08-06 LAB — HEMOGLOBIN A1C
Hgb A1c MFr Bld: 5.5 % (ref 4.8–5.6)
Mean Plasma Glucose: 111.15 mg/dL

## 2021-08-06 MED ORDER — FENTANYL CITRATE (PF) 100 MCG/2ML IJ SOLN
INTRAMUSCULAR | Status: AC
  Filled 2021-08-06: qty 2

## 2021-08-06 MED ORDER — VANCOMYCIN HCL 1500 MG/300ML IV SOLN
1500.0000 mg | Freq: Two times a day (BID) | INTRAVENOUS | Status: DC
  Administered 2021-08-06 – 2021-08-08 (×4): 1500 mg via INTRAVENOUS
  Filled 2021-08-06 (×5): qty 300

## 2021-08-06 MED ORDER — VANCOMYCIN HCL 1750 MG/350ML IV SOLN
1750.0000 mg | Freq: Once | INTRAVENOUS | Status: AC
  Administered 2021-08-06: 1750 mg via INTRAVENOUS
  Filled 2021-08-06: qty 350

## 2021-08-06 NOTE — TOC Progression Note (Signed)
Transition of Care [facility]) - Progression Note    Patient Details  Name: Garrett West MRN: [id number] Date of Birth: Aug 17, 1995  Transition of Care [facility]) CM/SW Contact  Harriet Masson, RN Phone Number: 08/06/2021, 3:43 PM  Clinical Narrative:    Patient requesting RNCM make PCP apt. Sent referral to CMA to make apt and place on AVS   Expected Discharge Plan: Home/Self Care Barriers to Discharge: Continued Medical Work up  Expected Discharge Plan and Services Expected Discharge Plan: Home/Self Care In-house Referral: Clinical Social Work     Living arrangements for the past 2 months: Single Family Home                                       Social Determinants of Health (SDOH) Interventions    Readmission Risk Interventions     No data to display

## 2021-08-06 NOTE — Anesthesia Postprocedure Evaluation (Signed)
Anesthesia Post Note  Patient: Garrett West  Procedure(s) Performed: IRRIGATION AND DEBRIDEMENT RIGHT INDEX FINGER (Right)     Patient location during evaluation: PACU Anesthesia Type: General Level of consciousness: awake and alert Pain management: pain level controlled Vital Signs Assessment: post-procedure vital signs reviewed and stable Respiratory status: spontaneous breathing, nonlabored ventilation, respiratory function stable and patient connected to nasal cannula oxygen Cardiovascular status: blood pressure returned to baseline and stable Postop Assessment: no apparent nausea or vomiting Anesthetic complications: no   No notable events documented.  Last Vitals:  Vitals:   08/06/21 0030 08/06/21 0353  BP: 135/84 121/79  Pulse: (!) 106 (!) 101  Resp: 14 16  Temp: 36.9 C 37.2 C  SpO2: 96% 95%    Last Pain:  Vitals:   08/06/21 0442  TempSrc:   PainSc: Asleep                 Nelle Don Rollyn Scialdone

## 2021-08-06 NOTE — Progress Notes (Signed)
Patient has medical history significant of IV drug use, Right index finger injury, swelling and tenderness for 5 days. Patient had I&D of right index finger. Patient has no insurance or PCP. TOC will follow for [facility] letter and PCP needs.

## 2021-08-06 NOTE — Progress Notes (Signed)
Patient arrived to the unit from PACU. VSS and patient complains of discomfort in his right hand. Patient is lethargic but easily arousable. Instructed patient to use call bell for assistance. Dressing on hand is clean, dry, and intact. Call bell is within reach and patient's bed is in lowest position with bed alarm activated. This RN will continue to monitor.

## 2021-08-06 NOTE — Progress Notes (Signed)
PROGRESS NOTE    Garrett West  XHB:[id number] DOB: 12-04-1995 DOA: 08/05/2021 PCP: Pcp, No    Brief Narrative:  26 year old history of methamphetamine and marijuana smoking, presented with traumatic right index finger injury, abscess and cellulitis.  Incision and drainage in OR by hand surgery.   Assessment & Plan:   Right index finger abscess, cellulitis:  Blood cultures negative so far.  Status post I&D, surgical cultures pending.  Presented with significant infection.  Continue Unasyn and vancomycin today pending final tissue cultures. Adequate pain medications. Postop management, drain management as per surgery.  Polysubstance abuse.  Methamphetamine and marijuana use. Counseled to quit.  He is seeking outpatient rehabs.  Smoker: Counseled to quit.  On nicotine patch.   DVT prophylaxis:   Ambulatory.   Code Status: Full code. Family Communication: None. Disposition Plan: Status is: Observation The patient will require care spanning > 2 midnights and should be moved to inpatient because: Significant infection of the finger needing IV antibiotics     Consultants:  Hand surgery  Procedures:  Incision drainage  Antimicrobials:  Unasyn and vancomycin 7/26--   Subjective: Patient seen in the morning rounds.  Denies any complaints.  Mild pain persist.  Afebrile.  Objective: Vitals:   08/06/21 0030 08/06/21 0353 08/06/21 0819 08/06/21 1220  BP: 135/84 121/79 118/68 111/61  Pulse: (!) 106 (!) 101 (!) 106 (!) 117  Resp: 14 16 16 16   Temp: 98.5 F (36.9 C) 99 F (37.2 C) 98.1 F (36.7 C) 98 F (36.7 C)  TempSrc: Oral Oral Oral Oral  SpO2: 96% 95% 95% 97%  Weight:      Height:        Intake/Output Summary (Last 24 hours) at 08/06/2021 1248 Last data filed at 08/05/2021 2248 Gross per 24 hour  Intake 1000 ml  Output 5 ml  Net 995 ml   Filed Weights   08/05/21 1407  Weight: 80 kg    Examination:  General: Comfortable. Cardiovascular: S1-S2  normal.  Regular rate rhythm. Respiratory: Bilateral clear.  No added sounds. Gastrointestinal: Soft.  Nontender. Ext: No swelling or edema.  No cyanosis. Neuro: Intact. Musculoskeletal: Right index finger with immediate postop dressing, surrounding swelling present. Skin: Intact.    Data Reviewed: I have personally reviewed following labs and imaging studies  CBC: Recent Labs  Lab 08/05/21 1423  WBC 13.4*  NEUTROABS 10.3*  HGB 15.2  HCT 45.6  MCV 86.7  PLT 295   Basic Metabolic Panel: Recent Labs  Lab 08/05/21 1423  NA 136  K 3.8  CL 103  CO2 22  GLUCOSE 169*  BUN 6  CREATININE 0.63  CALCIUM 9.4   GFR: Estimated Creatinine Clearance: 138.8 mL/min (by C-G formula based on SCr of 0.63 mg/dL). Liver Function Tests: No results for input(s): "AST", "ALT", "ALKPHOS", "BILITOT", "PROT", "ALBUMIN" in the last 168 hours. No results for input(s): "LIPASE", "AMYLASE" in the last 168 hours. No results for input(s): "AMMONIA" in the last 168 hours. Coagulation Profile: No results for input(s): "INR", "PROTIME" in the last 168 hours. Cardiac Enzymes: No results for input(s): "CKTOTAL", "CKMB", "CKMBINDEX", "TROPONINI" in the last 168 hours. BNP (last 3 results) No results for input(s): "PROBNP" in the last 8760 hours. HbA1C: Recent Labs    08/06/21 0355  HGBA1C 5.5   CBG: No results for input(s): "GLUCAP" in the last 168 hours. Lipid Profile: No results for input(s): "CHOL", "HDL", "LDLCALC", "TRIG", "CHOLHDL", "LDLDIRECT" in the last 72 hours. Thyroid Function Tests: No results for  input(s): "TSH", "T4TOTAL", "FREET4", "T3FREE", "THYROIDAB" in the last 72 hours. Anemia Panel: No results for input(s): "VITAMINB12", "FOLATE", "FERRITIN", "TIBC", "IRON", "RETICCTPCT" in the last 72 hours. Sepsis Labs: Recent Labs  Lab 08/05/21 1423 08/05/21 1948  LATICACIDVEN 2.3* 2.0*    Recent Results (from the past 240 hour(s))  Blood culture (routine x 2)     Status: None  (Preliminary result)   Collection Time: 08/05/21  5:35 PM   Specimen: BLOOD  Result Value Ref Range Status   Specimen Description BLOOD LEFT ANTECUBITAL  Final   Special Requests   Final    BOTTLES DRAWN AEROBIC AND ANAEROBIC Blood Culture adequate volume   Culture   Final    NO GROWTH < 24 HOURS Performed at [facility], [address] [place], [place] [address]    Report Status PENDING  Incomplete  Blood culture (routine x 2)     Status: None (Preliminary result)   Collection Time: 08/05/21  5:42 PM   Specimen: BLOOD  Result Value Ref Range Status   Specimen Description BLOOD RIGHT ANTECUBITAL  Final   Special Requests   Final    BOTTLES DRAWN AEROBIC AND ANAEROBIC Blood Culture adequate volume   Culture   Final    NO GROWTH < 24 HOURS Performed at [facility], [address] [place], [place] [address]    Report Status PENDING  Incomplete  Aerobic/Anaerobic Culture w Gram Stain (surgical/deep wound)     Status: None (Preliminary result)   Collection Time: 08/05/21 10:31 PM   Specimen: Wound  Result Value Ref Range Status   Specimen Description WOUND  Final   Special Requests RIGHT INDEX FINGER SPEC A  Final   Gram Stain   Final    NO SQUAMOUS EPITHELIAL CELLS SEEN RARE WBC SEEN FEW GRAM POSITIVE COCCI Performed at [facility], [address], [place], [place] [address]    Culture PENDING  Incomplete   Report Status PENDING  Incomplete         Radiology Studies: DG Finger Index Right  Result Date: 08/05/2021 CLINICAL DATA:  Right index finger infection the past 2-3 days following an injury from a thorn. EXAM: RIGHT INDEX FINGER 2+V COMPARISON:  None Available. FINDINGS: There is diffuse soft tissue swelling about the second digit without associated radiopaque foreign body or subcutaneous emphysema. No discrete areas of osteolysis to suggest osteomyelitis. No fracture or dislocation. Joint spaces are preserved. No erosions. IMPRESSION: Diffuse  soft tissue swelling about the index finger without radiopaque foreign body or radiographic evidence of osteomyelitis. Electronically Signed   By: Simonne Come M.D.   On: 08/05/2021 16:33        Scheduled Meds: Continuous Infusions:  ampicillin-sulbactam (UNASYN) IV 3 g (08/06/21 1212)   vancomycin       LOS: 0 days    Time spent: 35 minutes    Dorcas Carrow, MD [facility] Pager [phone]

## 2021-08-06 NOTE — Progress Notes (Signed)
   Subjective:  Resting comfortably in bed.  Pain well controlled.  Denies systemic symptoms.   Objective:   VITALS:   Vitals:   08/06/21 0819 08/06/21 1220 08/06/21 1632 08/06/21 1930  BP: 118/68 111/61 97/60 102/60  Pulse: (!) 106 (!) 117 (!) 110 (!) 104  Resp: 16 16 18 16   Temp: 98.1 F (36.7 C) 98 F (36.7 C) 98.4 F (36.9 C) 98.6 F (37 C)  TempSrc: Oral Oral Oral Oral  SpO2: 95% 97%  96%  Weight:      Height:        Gen: NAD, resting comfortably Pulm: Normal WOB on RA CV: BUE warm and well perfused R hand: Open wound at tip of index finger with fibrinous material in wound bed.  Erythema improved.  No TTP along flexor tendon sheath.  No purulent drainage.  Penrose drains in place.     Lab Results  Component Value Date   WBC 13.4 (H) 08/05/2021   HGB 15.2 08/05/2021   HCT 45.6 08/05/2021   MCV 86.7 08/05/2021   PLT 295 08/05/2021     Assessment/Plan:  26 yo M w/ dorsal abscess of right index finger.  Now POD 1 s/p I&D.   Dressing changed this evening Will check wound again tomorrow and pull penrose drain Will start BID wound care with warm, diluted Hibiclens solution and clean, dry dressings Continue IV abx, will follow culture results    25, MD 08/06/2021, 8:56 PM (828) 08/08/2021

## 2021-08-06 NOTE — TOC Initial Note (Signed)
Transition of Care [facility]) - Initial/Assessment Note    Patient Details  Name: Garrett West MRN: [id number] Date of Birth: 1995-05-26  Transition of Care [facility]) CM/SW Contact:    Mearl Latin, LCSW Phone Number: 08/06/2021, 2:47 PM  Clinical Narrative:                 CSW spoke with patient via Spanish Interpretor, Ashby Dawes. Patient confirmed he lives with friends and has transportation. He does not have a PCP and does request help getting one. CSW asked patient about his substance use and he stated he normally does it recreationally but that he has already sought help to stop at [facility] [place] [place]. He declined other resources. No other questions at this time.   Expected Discharge Plan: Home/Self Care Barriers to Discharge: Continued Medical Work up   Patient Goals and CMS Choice Patient states their goals for this hospitalization and ongoing recovery are:: Return home      Expected Discharge Plan and Services Expected Discharge Plan: Home/Self Care In-house Referral: Clinical Social Work     Living arrangements for the past 2 months: Single Family Home                                      Prior Living Arrangements/Services Living arrangements for the past 2 months: Single Family Home Lives with:: Friends Patient language and need for interpreter reviewed:: Yes (Speaks English and Spanish) Do you feel safe going back to the place where you live?: Yes      Need for Family Participation in Patient Care: No (Comment) Care giver support system in place?: Yes (comment)   Criminal Activity/Legal Involvement Pertinent to Current Situation/Hospitalization: No - Comment as needed  Activities of Daily Living Home Assistive Devices/Equipment: None ADL Screening (condition at time of admission) Patient's cognitive ability adequate to safely complete daily activities?: Yes Is the patient deaf or have difficulty hearing?: No Does the patient have  difficulty seeing, even when wearing glasses/contacts?: No Does the patient have difficulty concentrating, remembering, or making decisions?: No Patient able to express need for assistance with ADLs?: Yes Does the patient have difficulty dressing or bathing?: No Independently performs ADLs?: Yes (appropriate for developmental age) Does the patient have difficulty walking or climbing stairs?: No Weakness of Legs: None Weakness of Arms/Hands: None  Permission Sought/Granted                  Emotional Assessment Appearance:: Appears stated age Attitude/Demeanor/Rapport: Engaged Affect (typically observed): Accepting, Appropriate Orientation: : Oriented to Self, Oriented to Place, Oriented to  Time, Oriented to Situation Alcohol / Substance Use: Illicit Drugs Psych Involvement: No (comment)  Admission diagnosis:  Injury, superficial, finger with infection [S60.949A, L08.9] Cellulitis of head except face [L03.811] Patient Active Problem List   Diagnosis Date Noted   Injury, superficial, finger with infection 08/05/2021   Overdose 05/29/2020   Abnormal head CT 05/29/2020   Elevated troponin 05/29/2020   Rhabdomyolysis 05/29/2020   Elevated LFTs 05/29/2020   Acute metabolic encephalopathy 05/29/2020   Cerebrovascular accident (CVA) (HCC)    PCP:  Pcp, No Pharmacy:   [id number] - [place], [place] - [address] AT [facility] [address] [place] [address] Phone: [phone] Fax: [phone]  [id number] - [address], [place] - [address] [place] [address] Phone:  [phone] Fax: [phone]     Social Determinants of Health (SDOH) Interventions    Readmission Risk Interventions     No data to display

## 2021-08-07 LAB — CBC WITH DIFFERENTIAL/PLATELET
Abs Immature Granulocytes: 0.09 10*3/uL — ABNORMAL HIGH (ref 0.00–0.07)
Basophils Absolute: 0.1 10*3/uL (ref 0.0–0.1)
Basophils Relative: 1 %
Eosinophils Absolute: 0.2 10*3/uL (ref 0.0–0.5)
Eosinophils Relative: 2 %
HCT: 39.9 % (ref 39.0–52.0)
Hemoglobin: 13.3 g/dL (ref 13.0–17.0)
Immature Granulocytes: 1 %
Lymphocytes Relative: 14 %
Lymphs Abs: 1.3 10*3/uL (ref 0.7–4.0)
MCH: 28.9 pg (ref 26.0–34.0)
MCHC: 33.3 g/dL (ref 30.0–36.0)
MCV: 86.6 fL (ref 80.0–100.0)
Monocytes Absolute: 1.2 10*3/uL — ABNORMAL HIGH (ref 0.1–1.0)
Monocytes Relative: 12 %
Neutro Abs: 6.7 10*3/uL (ref 1.7–7.7)
Neutrophils Relative %: 70 %
Platelets: 269 10*3/uL (ref 150–400)
RBC: 4.61 MIL/uL (ref 4.22–5.81)
RDW: 11.9 % (ref 11.5–15.5)
WBC: 9.5 10*3/uL (ref 4.0–10.5)
nRBC: 0 % (ref 0.0–0.2)

## 2021-08-07 MED ORDER — IBUPROFEN 600 MG PO TABS: 600.0000 mg | ORAL_TABLET | Freq: Four times a day (QID) | ORAL | 0 refills | Status: DC | PRN

## 2021-08-07 MED ORDER — SULFAMETHOXAZOLE-TRIMETHOPRIM 800-160 MG PO TABS: 1.0000 | ORAL_TABLET | Freq: Two times a day (BID) | ORAL | 0 refills | Status: DC

## 2021-08-07 MED ORDER — IBUPROFEN 600 MG PO TABS
600.0000 mg | ORAL_TABLET | Freq: Four times a day (QID) | ORAL | Status: DC | PRN
  Administered 2021-08-07: 600 mg via ORAL
  Filled 2021-08-07: qty 1

## 2021-08-07 MED ORDER — CHLORHEXIDINE GLUCONATE 4 % EX LIQD
Freq: Once | CUTANEOUS | Status: AC
Start: 2021-08-07 — End: 2021-08-07
  Filled 2021-08-07 (×2): qty 15

## 2021-08-07 NOTE — Progress Notes (Signed)
Pharmacy Antibiotic Note  Garrett West is a 26 y.o. male for which pharmacy has been consulted for Unasyn and vancomycin dosing for cellulitis.  Patient with a history of drug use, polysubstance abuse, history of stroke. Patient presenting with 5 days of pain and swelling of the right index finger.  Scr WNL, cultures pending  Plan: Continue Unasyn 3 grams iv Q q 6 hours Continue Vancomycin 1500 mg iv Q 12 hours Follow cultures  Height: 5\' 5"  (165.1 cm) Weight: 80 kg (176 lb 5.9 oz) IBW/kg (Calculated) : 61.5  Temp (24hrs), Avg:98.2 F (36.8 C), Min:97.9 F (36.6 C), Max:98.6 F (37 C)  Recent Labs  Lab 08/05/21 1423 08/05/21 1948 08/07/21 0558  WBC 13.4*  --  9.5  CREATININE 0.63  --   --   LATICACIDVEN 2.3* 2.0*  --      Estimated Creatinine Clearance: 138.8 mL/min (by C-G formula based on SCr of 0.63 mg/dL).    No Known Allergies  Antimicrobials this admission: unasyn 7/26 >>  vancomycin 7/26 >>  Microbiology results: Pending  Thank you 8/26, PharmD 08/07/2021 8:23 AM

## 2021-08-07 NOTE — Progress Notes (Signed)
   Subjective:  No acute events overnight. Resting comfortably this morning.  Pain in finger improved. Denies systemic symptoms.   Objective:   VITALS:   Vitals:   08/06/21 1930 08/07/21 0011 08/07/21 0348 08/07/21 0817  BP: 102/60 95/60 (!) 90/57 102/63  Pulse: (!) 104 89 76 79  Resp: 16 15 16 18   Temp: 98.6 F (37 C) 98.3 F (36.8 C) 97.9 F (36.6 C) 97.9 F (36.6 C)  TempSrc: Oral Oral Oral Oral  SpO2: 96% 100% 97% 99%  Weight:      Height:        Gen: NAD, resting comfortably this AM Pulm: Normal WOB on RA CV: BUE warm and well perfused Right hand: index finger erythema much improved, no drainage from wounds w/ fibrinous material in distal radial skin defect, limited ROM secondary to swelling and stiffness    Lab Results  Component Value Date   WBC 9.5 08/07/2021   HGB 13.3 08/07/2021   HCT 39.9 08/07/2021   MCV 86.6 08/07/2021   PLT 269 08/07/2021     Assessment/Plan: 26 yo M w/ dorsal abscess of right index finger.  Now POD 2 s/p I&D.    Dressing changed again this AM; penrose drain pulled Discussed wound care with nurse this AM; BID soaks w/ warm, diluted Hibiclens solution and clean, dry dressings Continue IV abx, intra-op cultures growing S.aureus without speciation.   25, MD 08/07/2021, 10:47 AM [phone]

## 2021-08-07 NOTE — Progress Notes (Signed)
PROGRESS NOTE    Garrett West  YOV:[id number] DOB: 1995-06-19 DOA: 08/05/2021 PCP: Pcp, No    Brief Narrative:  26 year old history of methamphetamine and marijuana smoking, presented with traumatic right index finger injury, abscess and cellulitis.  Incision and drainage in OR by hand surgery. Cultures pending.  Assessment & Plan:   Right index finger abscess, cellulitis:  Blood cultures negative so far.  Status post I&D, surgical cultures growing plenty of a Staph aureus.  Final sensitivity pending.   Discussed with hand surgery, recommended to keep on IV antibiotics until final cultures.   Wound care and postop management as per surgery.   Currently on Unasyn and vancomycin.  Vancomycin alone will cover for any Staphylococcus.  We will discontinue Unasyn.   High-dose ibuprofen for pain relief.    Polysubstance abuse.  Methamphetamine and marijuana use. Counseled to quit.  He is seeking outpatient rehabs.  Smoker: Counseled to quit.  On nicotine patch.   DVT prophylaxis:   Ambulatory.   Code Status: Full code. Family Communication: None. Disposition Plan: Status is: Inpatient.  Significant finger space infection on IV antibiotics.     Consultants:  Hand surgery  Procedures:  Incision drainage  Antimicrobials:  Unasyn  7/26--7/28 Vancomycin 7/26----   Subjective: Seen in the morning rounds.  No overnight events.  Afebrile.  Moderate pain in the finger.  Not going through any withdrawal but he does get mild to moderate headache.  Will use ibuprofen.  Objective: Vitals:   08/07/21 0011 08/07/21 0348 08/07/21 0817 08/07/21 1145  BP: 95/60 (!) 90/57 102/63 102/68  Pulse: 89 76 79 76  Resp: 15 16 18 18   Temp: 98.3 F (36.8 C) 97.9 F (36.6 C) 97.9 F (36.6 C) 98.4 F (36.9 C)  TempSrc: Oral Oral Oral Oral  SpO2: 100% 97% 99% 97%  Weight:      Height:        Intake/Output Summary (Last 24 hours) at 08/07/2021 1348 Last data filed at 08/07/2021  1000 Gross per 24 hour  Intake --  Output 700 ml  Net -700 ml    Filed Weights   08/05/21 1407  Weight: 80 kg    Examination:  General: Comfortable. Cardiovascular: S1-S2 normal.  Regular rate rhythm. Respiratory: Bilateral clear.  No added sounds. Gastrointestinal: Soft.  Nontender. Ext: No swelling or edema.  No cyanosis. Neuro: Intact. Musculoskeletal: Right index finger with postop dressing.  Not removed by me.   Data Reviewed: I have personally reviewed following labs and imaging studies  CBC: Recent Labs  Lab 08/05/21 1423 08/07/21 0558  WBC 13.4* 9.5  NEUTROABS 10.3* 6.7  HGB 15.2 13.3  HCT 45.6 39.9  MCV 86.7 86.6  PLT 295 269    Basic Metabolic Panel: Recent Labs  Lab 08/05/21 1423  NA 136  K 3.8  CL 103  CO2 22  GLUCOSE 169*  BUN 6  CREATININE 0.63  CALCIUM 9.4    GFR: Estimated Creatinine Clearance: 138.8 mL/min (by C-G formula based on SCr of 0.63 mg/dL). Liver Function Tests: No results for input(s): "AST", "ALT", "ALKPHOS", "BILITOT", "PROT", "ALBUMIN" in the last 168 hours. No results for input(s): "LIPASE", "AMYLASE" in the last 168 hours. No results for input(s): "AMMONIA" in the last 168 hours. Coagulation Profile: No results for input(s): "INR", "PROTIME" in the last 168 hours. Cardiac Enzymes: No results for input(s): "CKTOTAL", "CKMB", "CKMBINDEX", "TROPONINI" in the last 168 hours. BNP (last 3 results) No results for input(s): "PROBNP" in the last 8760  hours. HbA1C: Recent Labs    08/06/21 0355  HGBA1C 5.5    CBG: No results for input(s): "GLUCAP" in the last 168 hours. Lipid Profile: No results for input(s): "CHOL", "HDL", "LDLCALC", "TRIG", "CHOLHDL", "LDLDIRECT" in the last 72 hours. Thyroid Function Tests: No results for input(s): "TSH", "T4TOTAL", "FREET4", "T3FREE", "THYROIDAB" in the last 72 hours. Anemia Panel: No results for input(s): "VITAMINB12", "FOLATE", "FERRITIN", "TIBC", "IRON", "RETICCTPCT" in the  last 72 hours. Sepsis Labs: Recent Labs  Lab 08/05/21 1423 08/05/21 1948  LATICACIDVEN 2.3* 2.0*     Recent Results (from the past 240 hour(s))  Blood culture (routine x 2)     Status: None (Preliminary result)   Collection Time: 08/05/21  5:35 PM   Specimen: BLOOD  Result Value Ref Range Status   Specimen Description BLOOD LEFT ANTECUBITAL  Final   Special Requests   Final    BOTTLES DRAWN AEROBIC AND ANAEROBIC Blood Culture adequate volume   Culture   Final    NO GROWTH 2 DAYS Performed at [facility], [address], [place], [place] [address]    Report Status PENDING  Incomplete  Blood culture (routine x 2)     Status: None (Preliminary result)   Collection Time: 08/05/21  5:42 PM   Specimen: BLOOD  Result Value Ref Range Status   Specimen Description BLOOD RIGHT ANTECUBITAL  Final   Special Requests   Final    BOTTLES DRAWN AEROBIC AND ANAEROBIC Blood Culture adequate volume   Culture   Final    NO GROWTH 2 DAYS Performed at [facility], [address], [place], [place] [address]    Report Status PENDING  Incomplete  Aerobic/Anaerobic Culture w Gram Stain (surgical/deep wound)     Status: None (Preliminary result)   Collection Time: 08/05/21 10:31 PM   Specimen: Wound  Result Value Ref Range Status   Specimen Description WOUND  Final   Special Requests RIGHT INDEX FINGER SPEC A  Final   Gram Stain   Final    NO SQUAMOUS EPITHELIAL CELLS SEEN RARE WBC SEEN FEW GRAM POSITIVE COCCI    Culture   Final    ABUNDANT STAPHYLOCOCCUS AUREUS SUSCEPTIBILITIES TO FOLLOW Performed at [facility], [address] [place], [place] [address]    Report Status PENDING  Incomplete         Radiology Studies: DG Finger Index Right  Result Date: 08/05/2021 CLINICAL DATA:  Right index finger infection the past 2-3 days following an injury from a thorn. EXAM: RIGHT INDEX FINGER 2+V COMPARISON:  None Available. FINDINGS: There is diffuse soft tissue  swelling about the second digit without associated radiopaque foreign body or subcutaneous emphysema. No discrete areas of osteolysis to suggest osteomyelitis. No fracture or dislocation. Joint spaces are preserved. No erosions. IMPRESSION: Diffuse soft tissue swelling about the index finger without radiopaque foreign body or radiographic evidence of osteomyelitis. Electronically Signed   By: Simonne Come M.D.   On: 08/05/2021 16:33        Scheduled Meds: Continuous Infusions:  vancomycin 1,500 mg (08/07/21 0800)     LOS: 1 day    Time spent: 25 minutes    Dorcas Carrow, MD [facility] Pager [phone]

## 2021-08-07 NOTE — Plan of Care (Signed)
  Problem: Health Behavior/Discharge Planning: Goal: Ability to manage health-related needs will improve Outcome: Progressing   Problem: Clinical Measurements: Goal: Ability to maintain clinical measurements within normal limits will improve Outcome: Progressing   

## 2021-08-08 DIAGNOSIS — S60942A Unspecified superficial injury of right middle finger, initial encounter: Secondary | ICD-10-CM

## 2021-08-08 MED ORDER — SULFAMETHOXAZOLE-TRIMETHOPRIM 800-160 MG PO TABS: 1.0000 | ORAL_TABLET | Freq: Two times a day (BID) | ORAL | 0 refills | Status: AC

## 2021-08-08 NOTE — Progress Notes (Addendum)
Pt seen and examined this afternoon.  Pain well controlled.  Sleeping comfortably.  Gen: AO x3, NAD Pulm: Normal WOB on RA CV: Normal rate, BUE warm and well perfused R hand: index finger wound over DIP joint has opened, scant purulent drainage in wound bed w/ fibrinous material, still no pain along flexor tendon sheath  AP: 26 yo M w/ dorsal abscess of right index finger.  Now POD 3 s/p I&D.    Pt very anxious about going home and getting back to his family.  He would like to try doing wound care at home.  We discussed the importance of diligent wound care with warm, soapy water with either Hibiclens or anti-bacterial soap twice per day with application of clean, dry dressings.  We discussed that if the wound fails to heal then he may need another procedure for either wound coverage, which may need to be staged depending on the appearance of the wound, or an amputation.  Discussed that an amputation would be less surgery and would result in a faster recovery.   - Dressing changed again this AM and wound care performed - Please ensure BID soaks w/ warm, diluted Hibiclens solution and clean, dry dressings; patient with wound care only once yesterday and hasn't happened yet today - Continue IV abx, intra-op cultures growing MRSA - Please make NPO at midnight in case patient needs repeat debridement  Marlyne Beards, M.D. [facility]

## 2021-08-08 NOTE — Discharge Summary (Signed)
Physician Discharge Summary  Garrett West NKN:[id number] DOB: 1995/08/18 DOA: 08/05/2021                                     PATIENT LEFT AMA     PCP: Pcp, No  Admit date: 08/05/2021 Discharge date: 08/08/2021    Discharge Condition: LEFT AMA  Brief/Interim Summary:  26 year old history of methamphetamine and marijuana smoking, presented with traumatic right index finger injury, abscess and cellulitis.  Incision and drainage in OR by hand surgery.   Right index finger abscess/cellulitis/infection - Blood cultures negative so far.  Status post I&D, surgical cultures growing plenty of a Staph aureus.  Sensitivity showing MRSA, he was treated with IV vancomycin during hospital stay.  He was followed closely by hand surgery with plan for repeat I&D, patient adamant about leaving today despite multiple discussions, I have informed him about possible need of amputation if he leaves AMA, sepsis and even death but he is adamant about leaving. -I did give him Bactrim DS prescription for 10-day, gauze with Hibiclens packets, with instruction to come back to ED if he changes his mind.   Polysubstance abuse.  Methamphetamine and marijuana use. Counseled to quit.  He is seeking outpatient rehabs.   Smoker: Counseled to quit.  On nicotine patch.   Discharge Diagnoses:  Principal Problem:   Injury, superficial, finger with infection    Discharge Instructions   Allergies as of 08/08/2021   No Known Allergies      Medication List     TAKE these medications    ibuprofen 600 MG tablet Commonly known as: ADVIL Take 1 tablet (600 mg total) by mouth every 6 (six) hours as needed for headache, fever, moderate pain or mild pain. Take after meals   sulfamethoxazole-trimethoprim 800-160 MG tablet Commonly known as: BACTRIM DS Take 1 tablet by mouth 2 (two) times daily for 10 days.        Follow-up Information     Rema Fendt, NP Follow up.   Specialty: Nurse  Practitioner Why: TIME :  2:00 pm DATE:  AUGUST 09 ,2023 Contact information: [address] [place] [address] [phone]                No Known Allergies  Consultations: Hand surgery Dr. Marlyne Beards      Procedures/Studies: DG Finger Index Right  Result Date: 08/05/2021 CLINICAL DATA:  Right index finger infection the past 2-3 days following an injury from a thorn. EXAM: RIGHT INDEX FINGER 2+V COMPARISON:  None Available. FINDINGS: There is diffuse soft tissue swelling about the second digit without associated radiopaque foreign body or subcutaneous emphysema. No discrete areas of osteolysis to suggest osteomyelitis. No fracture or dislocation. Joint spaces are preserved. No erosions. IMPRESSION: Diffuse soft tissue swelling about the index finger without radiopaque foreign body or radiographic evidence of osteomyelitis. Electronically Signed   By: Simonne Come M.D.   On: 08/05/2021 16:33      Subjective: He denies any complaints today, no nausea, no vomiting, no fever or chills  Discharge Exam: Vitals:   08/08/21 0027 08/08/21 0333  BP: (!) 129/91 118/80  Pulse: 74 85  Resp: 16 16  Temp: 98.3 F (36.8 C) 97.8 F (36.6 C)  SpO2: 95%    Vitals:   08/07/21 1627 08/07/21 2046 08/08/21 0027 08/08/21 0333  BP: (!) 99/58 122/82 (!) 129/91 118/80  Pulse: 80  74 85  Resp: 18 16 16 16   Temp: 98.2 F (36.8 C)  98.3 F (36.8 C) 97.8 F (36.6 C)  TempSrc: Oral  Oral Oral  SpO2: 97% 96% 95%   Weight:      Height:        General: Pt is alert, awake, not in acute distress Right index finger bandaged Cardiovascular: RRR, S1/S2 +, no rubs, no gallops Respiratory: CTA bilaterally, no wheezing, no rhonchi Abdominal: Soft, NT, ND, bowel sounds + Extremities: no edema, no cyanosis    The results of significant diagnostics from this hospitalization (including imaging, microbiology, ancillary and laboratory) are listed below for reference.      Microbiology: Recent Results (from the past 240 hour(s))  Blood culture (routine x 2)     Status: None (Preliminary result)   Collection Time: 08/05/21  5:35 PM   Specimen: BLOOD  Result Value Ref Range Status   Specimen Description BLOOD LEFT ANTECUBITAL  Final   Special Requests   Final    BOTTLES DRAWN AEROBIC AND ANAEROBIC Blood Culture adequate volume   Culture   Final    NO GROWTH 3 DAYS Performed at [facility], [address], [place], [place]    Report Status PENDING  Incomplete  Blood culture (routine x 2)     Status: None (Preliminary result)   Collection Time: 08/05/21  5:42 PM   Specimen: BLOOD  Result Value Ref Range Status   Specimen Description BLOOD RIGHT ANTECUBITAL  Final   Special Requests   Final    BOTTLES DRAWN AEROBIC AND ANAEROBIC Blood Culture adequate volume   Culture   Final    NO GROWTH 3 DAYS Performed at [facility], [address] [place], [place]    Report Status PENDING  Incomplete  Aerobic/Anaerobic Culture w Gram Stain (surgical/deep wound)     Status: None (Preliminary result)   Collection Time: 08/05/21 10:31 PM   Specimen: Wound  Result Value Ref Range Status   Specimen Description WOUND  Final   Special Requests RIGHT INDEX FINGER SPEC A  Final   Gram Stain   Final    NO SQUAMOUS EPITHELIAL CELLS SEEN RARE WBC SEEN FEW GRAM POSITIVE COCCI    Culture   Final    ABUNDANT METHICILLIN RESISTANT STAPHYLOCOCCUS AUREUS HOLDING FOR POSSIBLE ANAEROBE Performed at [facility], [address] [place], [place]    Report Status PENDING  Incomplete   Organism ID, Bacteria METHICILLIN RESISTANT STAPHYLOCOCCUS AUREUS  Final      Susceptibility   Methicillin resistant staphylococcus aureus - MIC*    CIPROFLOXACIN >=8 RESISTANT Resistant     ERYTHROMYCIN >=8 RESISTANT Resistant     GENTAMICIN <=0.5 SENSITIVE Sensitive     OXACILLIN >=4 RESISTANT Resistant     TETRACYCLINE <=1 SENSITIVE  Sensitive     VANCOMYCIN 1 SENSITIVE Sensitive     TRIMETH/SULFA <=10 SENSITIVE Sensitive     CLINDAMYCIN <=0.25 SENSITIVE Sensitive     RIFAMPIN <=0.5 SENSITIVE Sensitive     Inducible Clindamycin NEGATIVE Sensitive     * ABUNDANT METHICILLIN RESISTANT STAPHYLOCOCCUS AUREUS     Labs: BNP (last 3 results) No results for input(s): "BNP" in the last 8760 hours. Basic Metabolic Panel: Recent Labs  Lab 08/05/21 1423  NA 136  K 3.8  CL 103  CO2 22  GLUCOSE 169*  BUN 6  CREATININE 0.63  CALCIUM 9.4   Liver Function Tests: No results  for input(s): "AST", "ALT", "ALKPHOS", "BILITOT", "PROT", "ALBUMIN" in the last 168 hours. No results for input(s): "LIPASE", "AMYLASE" in the last 168 hours. No results for input(s): "AMMONIA" in the last 168 hours. CBC: Recent Labs  Lab 08/05/21 1423 08/07/21 0558  WBC 13.4* 9.5  NEUTROABS 10.3* 6.7  HGB 15.2 13.3  HCT 45.6 39.9  MCV 86.7 86.6  PLT 295 269   Cardiac Enzymes: No results for input(s): "CKTOTAL", "CKMB", "CKMBINDEX", "TROPONINI" in the last 168 hours. BNP: Invalid input(s): "POCBNP" CBG: No results for input(s): "GLUCAP" in the last 168 hours. D-Dimer No results for input(s): "DDIMER" in the last 72 hours. Hgb A1c Recent Labs    08/06/21 0355  HGBA1C 5.5   Lipid Profile No results for input(s): "CHOL", "HDL", "LDLCALC", "TRIG", "CHOLHDL", "LDLDIRECT" in the last 72 hours. Thyroid function studies No results for input(s): "TSH", "T4TOTAL", "T3FREE", "THYROIDAB" in the last 72 hours.  Invalid input(s): "FREET3" Anemia work up No results for input(s): "VITAMINB12", "FOLATE", "FERRITIN", "TIBC", "IRON", "RETICCTPCT" in the last 72 hours. Urinalysis    Component Value Date/Time   COLORURINE YELLOW 05/29/2020 1209   APPEARANCEUR CLEAR 05/29/2020 1209   LABSPEC 1.016 05/29/2020 1209   PHURINE 6.0 05/29/2020 1209   GLUCOSEU >=500 (A) 05/29/2020 1209   HGBUR MODERATE (A) 05/29/2020 1209   BILIRUBINUR NEGATIVE  05/29/2020 1209   KETONESUR NEGATIVE 05/29/2020 1209   PROTEINUR 30 (A) 05/29/2020 1209   NITRITE NEGATIVE 05/29/2020 1209   LEUKOCYTESUR NEGATIVE 05/29/2020 1209   Sepsis Labs Recent Labs  Lab 08/05/21 1423 08/07/21 0558  WBC 13.4* 9.5   Microbiology Recent Results (from the past 240 hour(s))  Blood culture (routine x 2)     Status: None (Preliminary result)   Collection Time: 08/05/21  5:35 PM   Specimen: BLOOD  Result Value Ref Range Status   Specimen Description BLOOD LEFT ANTECUBITAL  Final   Special Requests   Final    BOTTLES DRAWN AEROBIC AND ANAEROBIC Blood Culture adequate volume   Culture   Final    NO GROWTH 3 DAYS Performed at [facility], [address], [place], [place] [address]    Report Status PENDING  Incomplete  Blood culture (routine x 2)     Status: None (Preliminary result)   Collection Time: 08/05/21  5:42 PM   Specimen: BLOOD  Result Value Ref Range Status   Specimen Description BLOOD RIGHT ANTECUBITAL  Final   Special Requests   Final    BOTTLES DRAWN AEROBIC AND ANAEROBIC Blood Culture adequate volume   Culture   Final    NO GROWTH 3 DAYS Performed at [facility], [address], [place], [place] [address]    Report Status PENDING  Incomplete  Aerobic/Anaerobic Culture w Gram Stain (surgical/deep wound)     Status: None (Preliminary result)   Collection Time: 08/05/21 10:31 PM   Specimen: Wound  Result Value Ref Range Status   Specimen Description WOUND  Final   Special Requests RIGHT INDEX FINGER SPEC A  Final   Gram Stain   Final    NO SQUAMOUS EPITHELIAL CELLS SEEN RARE WBC SEEN FEW GRAM POSITIVE COCCI    Culture   Final    ABUNDANT METHICILLIN RESISTANT STAPHYLOCOCCUS AUREUS HOLDING FOR POSSIBLE ANAEROBE Performed at [facility], [address] [place], [place] [address]    Report Status PENDING  Incomplete   Organism ID, Bacteria METHICILLIN RESISTANT STAPHYLOCOCCUS AUREUS  Final      Susceptibility  Methicillin resistant staphylococcus aureus - MIC*    CIPROFLOXACIN >=8 RESISTANT Resistant     ERYTHROMYCIN >=8 RESISTANT Resistant     GENTAMICIN <=0.5 SENSITIVE Sensitive     OXACILLIN >=4 RESISTANT Resistant     TETRACYCLINE <=1 SENSITIVE Sensitive     VANCOMYCIN 1 SENSITIVE Sensitive     TRIMETH/SULFA <=10 SENSITIVE Sensitive     CLINDAMYCIN <=0.25 SENSITIVE Sensitive     RIFAMPIN <=0.5 SENSITIVE Sensitive     Inducible Clindamycin NEGATIVE Sensitive     * ABUNDANT METHICILLIN RESISTANT STAPHYLOCOCCUS AUREUS     Time coordinating discharge: Over 30 minutes  SIGNED:   Huey Bienenstock, MD  [facility] 08/08/2021, 3:38 PM Pager   If 7PM-7AM, please contact night-coverage [email or web address]

## 2021-08-09 LAB — AEROBIC/ANAEROBIC CULTURE W GRAM STAIN (SURGICAL/DEEP WOUND): Gram Stain: NONE SEEN

## 2021-08-10 LAB — CULTURE, BLOOD (ROUTINE X 2)
Culture: NO GROWTH
Culture: NO GROWTH
Special Requests: ADEQUATE
Special Requests: ADEQUATE

## 2021-08-10 NOTE — Op Note (Signed)
Date of Surgery: 08/10/2021  INDICATIONS: Patient is a 26 y.o.-year-old male with what appears to be an abscess involving the dorsal aspect of the right index finger with an area of skin necrosis involving the dorsal distal aspect of the finger around the DIP joint.  He thinks he was stuck by a thorn 5 days ago.  He denies any pain at the volar aspect of the finger.  No pain with range of motion of the finger..  Risks, benefits, and alternatives to surgery were again discussed with the patient in the preoperative area. The patient wishes to proceed with surgery.  Informed consent was signed after our discussion.   PREOPERATIVE DIAGNOSIS:  1.  Dorsal right index finger abscess  POSTOPERATIVE DIAGNOSIS: Same.  PROCEDURE: 1.  Irrigation and excisional debridement of the right index finger dorsal abscess   SURGEON: Audria Nine, M.D.  ASSIST:   ANESTHESIA:  general  IV FLUIDS AND URINE: See anesthesia.  ESTIMATED BLOOD LOSS: Less than 5 mL.  IMPLANTS: * No implants in log *   DRAINS: Penrose drain x1  COMPLICATIONS: None  DESCRIPTION OF PROCEDURE: The patient was met in the preoperative holding area where the surgical site was marked and the consent form was verified.  The patient was then taken to the operating room and transferred to the operating table.  All bony prominences were well padded.  A tourniquet was applied to the right forearm.  General endotracheal anesthesia was induced.  The operative extremity was prepped and draped in the usual and sterile fashion.  A formal time-out was performed to confirm that this was the correct patient, surgery, side, and site.   Following timeout, the limb was exsanguinated with gravity and the tourniquet inflated to 50 mmHg.  The open wound to the dorsal and distal aspect of his fingers extended both proximally and distally.  There was frank purulence coming from the proximal aspect of the wound around the level of the middle phalanx.  A  counterincision was made proximally over the proximal phalanx.  Culture swabs were taken from this purulent material.  A rongeur was used to debride any fibrinous or necrotic tissue from the open wound.  An incision was made on the volar aspect of the index finger over the A1 pulley.  The skin was incised.  Blunt dissection was used to identify the A1 pulley.  Retractors were placed radial and and ulnar to protect the respective neurovascular bundles.  The A1 pulley was sharply opened.  There was no purulence or fluid collection within the flexor tendon sheath.  The wounds were thoroughly irrigated with copious sterile saline via low flow cystoscopy tubing.  A portion of the distal incision was closed using 1 single 4-0 nylon suture.  A Penrose drain was then passed in the proximal incision over the proximal phalanx into the open wound to allow for continued drainage.  The tourniquet was let down and hemostasis was achieved.  The finger was warm, pink, and well-perfused in the procedure.  The wound was then dressed with Xeroform, 4 x 4's, Kling wrap, and an Ace wrap.  The patient was then reversed from anesthesia and extubated uneventfully.  He was transferred to the postoperative bed.  All counts were correct x2 the end the procedure.  The patient was then taken the PACU in stable condition.   POSTOPERATIVE PLAN: He will be admitted for IV antibiotics and to follow culture results.  We will start daily wound care on postop day 1.  Dispo pending culture results, final antibiotic choice, and wound care education.  Audria Nine, MD 5:34 PM

## 2021-08-10 NOTE — Progress Notes (Signed)
Erroneous encounter-disregard

## 2021-08-19 ENCOUNTER — Encounter: Payer: Self-pay | Admitting: Family

## 2021-08-19 DIAGNOSIS — Z789 Other specified health status: Secondary | ICD-10-CM

## 2021-08-19 DIAGNOSIS — Z7689 Persons encountering health services in other specified circumstances: Secondary | ICD-10-CM

## 2021-08-19 DIAGNOSIS — L089 Local infection of the skin and subcutaneous tissue, unspecified: Secondary | ICD-10-CM

## 2021-08-19 DIAGNOSIS — Z09 Encounter for follow-up examination after completed treatment for conditions other than malignant neoplasm: Secondary | ICD-10-CM

## 2022-07-29 ENCOUNTER — Encounter (HOSPITAL_COMMUNITY): Payer: Self-pay

## 2022-07-29 ENCOUNTER — Other Ambulatory Visit: Payer: Self-pay

## 2022-07-29 ENCOUNTER — Emergency Department (HOSPITAL_COMMUNITY)
Admission: EM | Admit: 2022-07-29 | Discharge: 2022-07-30 | Disposition: A | Discharge status: Home / Self Care | Attending: Emergency Medicine | Admitting: Emergency Medicine

## 2022-07-29 DIAGNOSIS — R55 Syncope and collapse: Secondary | ICD-10-CM | POA: Diagnosis not present

## 2022-07-29 DIAGNOSIS — R072 Precordial pain: Secondary | ICD-10-CM | POA: Diagnosis not present

## 2022-07-29 NOTE — ED Triage Notes (Signed)
Pt found outside at 10:30 in the yard at jail, was incoherrant folr about 5 min, pupils pinpoint and not acting like himself. Now is alert and oriented but still not acting like himself.

## 2022-07-30 ENCOUNTER — Emergency Department (HOSPITAL_COMMUNITY)

## 2022-07-30 LAB — COMPREHENSIVE METABOLIC PANEL
ALT: 26 U/L (ref 0–44)
AST: 26 U/L (ref 15–41)
Albumin: 3.9 g/dL (ref 3.5–5.0)
Alkaline Phosphatase: 60 U/L (ref 38–126)
Anion gap: 10 (ref 5–15)
BUN: 6 mg/dL (ref 6–20)
CO2: 23 mmol/L (ref 22–32)
Calcium: 8.8 mg/dL — ABNORMAL LOW (ref 8.9–10.3)
Chloride: 101 mmol/L (ref 98–111)
Creatinine, Ser: 0.84 mg/dL (ref 0.61–1.24)
GFR, Estimated: >60 mL/min (ref >60)
Glucose, Bld: 106 mg/dL — ABNORMAL HIGH (ref 70–99)
Potassium: 3.3 mmol/L — ABNORMAL LOW (ref 3.5–5.1)
Sodium: 134 mmol/L — ABNORMAL LOW (ref 135–145)
Total Bilirubin: 0.4 mg/dL (ref 0.3–1.2)
Total Protein: 6.8 g/dL (ref 6.5–8.1)

## 2022-07-30 LAB — CBC WITH DIFFERENTIAL/PLATELET
Abs Immature Granulocytes: 0.02 10*3/uL (ref 0.00–0.07)
Basophils Absolute: 0 10*3/uL (ref 0.0–0.1)
Basophils Relative: 1 %
Eosinophils Absolute: 0.1 10*3/uL (ref 0.0–0.5)
Eosinophils Relative: 1 %
HCT: 39.5 % (ref 39.0–52.0)
Hemoglobin: 13.2 g/dL (ref 13.0–17.0)
Immature Granulocytes: 0 %
Lymphocytes Relative: 19 %
Lymphs Abs: 1.6 10*3/uL (ref 0.7–4.0)
MCH: 28.4 pg (ref 26.0–34.0)
MCHC: 33.4 g/dL (ref 30.0–36.0)
MCV: 85.1 fL (ref 80.0–100.0)
Monocytes Absolute: 0.6 10*3/uL (ref 0.1–1.0)
Monocytes Relative: 7 %
Neutro Abs: 6.4 10*3/uL (ref 1.7–7.7)
Neutrophils Relative %: 72 %
Platelets: 206 10*3/uL (ref 150–400)
RBC: 4.64 MIL/uL (ref 4.22–5.81)
RDW: 12.4 % (ref 11.5–15.5)
WBC: 8.7 10*3/uL (ref 4.0–10.5)
nRBC: 0 % (ref 0.0–0.2)

## 2022-07-30 LAB — RAPID URINE DRUG SCREEN, HOSP PERFORMED
Amphetamines: NOT DETECTED
Barbiturates: NOT DETECTED
Benzodiazepines: NOT DETECTED
Cocaine: NOT DETECTED
Opiates: NOT DETECTED
Tetrahydrocannabinol: NOT DETECTED

## 2022-07-30 LAB — SALICYLATE LEVEL: Salicylate Lvl: <7 mg/dL — ABNORMAL LOW (ref 7.0–30.0)

## 2022-07-30 LAB — TROPONIN I (HIGH SENSITIVITY): Troponin I (High Sensitivity): 5 ng/L (ref <18)

## 2022-07-30 LAB — ETHANOL: Alcohol, Ethyl (B): <10 mg/dL (ref <10)

## 2022-07-30 LAB — ACETAMINOPHEN LEVEL: Acetaminophen (Tylenol), Serum: <10 ug/mL — ABNORMAL LOW (ref 10–30)

## 2022-07-30 MED ORDER — SODIUM CHLORIDE 0.9 % IV BOLUS
1000.0000 mL | Freq: Once | INTRAVENOUS | Status: AC
  Administered 2022-07-30: 1000 mL via INTRAVENOUS

## 2022-07-30 NOTE — ED Provider Notes (Signed)
[place] EMERGENCY DEPARTMENT AT [facility] Provider Note   CSN: [id number] Arrival date & time: 07/29/22  2338     History  Chief Complaint  Patient presents with   Near Syncope    Garrett West is a 27 y.o. male.  HPI   Patient without significant medical history presented with complaints of near syncope.  Patient is a difficult historian, states that when he woke up today he felt okay, but after lunch she was feeling unwell, states he is having some slight chest pain and abdominal pain.  Patient states the chest pain was going on for the last 2 weeks, states it is constant, mainly feels at nighttime, describes a pressure pain which lasts a second and resolves, denies shortness of breath, pleuritic chest pain, no cardiac history, no history of PEs or DVTs no recent surgeries no long immobilizations, he is not being treated for diabetes hypertension or hyperlipidemia he denies tobacco use, or illicit drug use.   Patient is presenting from jail, security guard was at bedside he states that he was asked to bring patient to the hospital because there were concerns of possible drug overdose, states that patient was out in the yard and nearly passed out, since that time patient has not been back to his baseline, he is not given anything while at prison.  Home Medications Prior to Admission medications   Medication Sig Start Date End Date Taking? Authorizing Provider  ibuprofen (ADVIL) 600 MG tablet Take 1 tablet (600 mg total) by mouth every 6 (six) hours as needed for headache, fever, moderate pain or mild pain. Take after meals 08/07/21   Dorcas Carrow, MD      Allergies    Patient has no known allergies.    Review of Systems   Review of Systems  Constitutional:  Negative for chills and fever.  Respiratory:  Negative for shortness of breath.   Cardiovascular:  Negative for chest pain.  Gastrointestinal:  Negative for abdominal pain.  Neurological:  Negative  for headaches.    Physical Exam Updated Vital Signs BP 100/60   Pulse 77   Temp 97.6 F (36.4 C) (Oral)   Resp 17   Ht 5\' 5"  (1.651 m)   Wt 79.8 kg   SpO2 100%   BMI 29.29 kg/m  Physical Exam Vitals and nursing note reviewed.  Constitutional:      General: He is not in acute distress.    Appearance: He is not ill-appearing.  HENT:     Head: Normocephalic and atraumatic.     Comments: No gross deformity of the head present no raccoon eyes or Battle sign noted.    Nose: No congestion.     Mouth/Throat:     Mouth: Mucous membranes are moist.     Pharynx: Oropharynx is clear. No oropharyngeal exudate or posterior oropharyngeal erythema.     Comments: No trismus no torticollis no oral trauma present. Eyes:     Extraocular Movements: Extraocular movements intact.     Conjunctiva/sclera: Conjunctivae normal.     Pupils: Pupils are equal, round, and reactive to light.  Cardiovascular:     Rate and Rhythm: Normal rate and regular rhythm.     Pulses: Normal pulses.     Heart sounds: No murmur heard.    No friction rub. No gallop.  Pulmonary:     Effort: No respiratory distress.     Breath sounds: No wheezing, rhonchi or rales.  Abdominal:     Palpations: Abdomen  is soft.     Tenderness: There is no abdominal tenderness. There is no right CVA tenderness or left CVA tenderness.  Musculoskeletal:     Right lower leg: No edema.     Left lower leg: No edema.     Comments: Spine palpated center to palpation no step-off deformities noted no pelvis instability no leg shortening.  Skin:    General: Skin is warm and dry.  Neurological:     Mental Status: He is alert.     Comments: Patient is somnolent on my exam but easily arousable, cranial nerves II through XII grossly intact, able to follow two-step commands, no regular weakness present.  Psychiatric:        Mood and Affect: Mood normal.     ED Results / Procedures / Treatments   Labs (all labs ordered are listed, but only  abnormal results are displayed) Labs Reviewed  CBC WITH DIFFERENTIAL/PLATELET  COMPREHENSIVE METABOLIC PANEL  RAPID URINE DRUG SCREEN, HOSP PERFORMED  ACETAMINOPHEN LEVEL  ETHANOL  SALICYLATE LEVEL  TROPONIN I (HIGH SENSITIVITY)  TROPONIN I (HIGH SENSITIVITY)    EKG EKG Interpretation Date/Time:  Thursday July 29 2022 23:42:29 EDT Ventricular Rate:  99 PR Interval:  172 QRS Duration:  94 QT Interval:  348 QTC Calculation: 447 R Axis:   106  Text Interpretation: Sinus rhythm Consider right ventricular hypertrophy ST elev, probable normal early repol pattern When compared with ECG of 05/29/2020, HEART RATE has decreased Confirmed by Dione Booze ([id number]) on 07/30/2022 12:20:21 AM  Radiology No results found.  Procedures Procedures    Medications Ordered in ED Medications  sodium chloride 0.9 % bolus 1,000 mL (1,000 mLs Intravenous New Bag/Given 07/30/22 0523)    ED Course/ Medical Decision Making/ A&P                             Medical Decision Making Amount and/or Complexity of Data Reviewed Labs: ordered. Radiology: ordered.   This patient presents to the ED for concern of near syncope, this involves an extensive number of treatment options, and is a complaint that carries with it a high risk of complications and morbidity.  The differential diagnosis includes ACS, PE, dissection, CVA, metabolic abnormality    Additional history obtained:  Additional history obtained from security guard External records from outside source obtained and reviewed including recent ER notes   Co morbidities that complicate the patient evaluation  N/A  Social Determinants of Health:  Incarcerated    Lab Tests:  I Ordered, and personally interpreted labs.  The pertinent results include: CBC is unremarkable, ethanol is negative   Imaging Studies ordered:  I ordered imaging studies including chest x-ray, CT head I independently visualized and interpreted imaging which  showed spoke with Dr watt radiology reviewed CT head and was negative for acute findings I agree with the radiologist interpretation   Cardiac Monitoring:  The patient was maintained on a cardiac monitor.  I personally viewed and interpreted the cardiac monitored which showed an underlying rhythm of:  without signs of ischemia   Medicines ordered and prescription drug management:  I ordered medication including intravenous fluids I have reviewed the patients home medicines and have made adjustments as needed  Critical Interventions:  N/A   Reevaluation:  Presents with near syncope, patient's somnolent on my exam but arousable, he had a benign physical exam, unclear etiology, will obtain screening lab workup, imaging, continue to monitor.  Noted that  patient's BP has been slightly soft, suspect this is like dehydration as well as being asleep, will provide a liter of fluids.  Consultations Obtained:  N/a    Test Considered:  N/a    Rule out low suspicion for internal head bleed and or mass as CT imaging is negative for acute findings.  Low suspicion for CVA she has no focal deficit present my exam.  Suspicion for sepsis is low at this time as she is nontoxic-appearing, afebrile, nontachycardic, no leukocytosis.  Suspicion for PE is low at this time patient nontachypneic nonhypoxic, endorsing pleuritic chest pain, he has low risk factors.  He was initially tachycardic but this is since resolved with fluids possibly he was just dehydrated.  Dispostion and problem list  Due to shift change shift patient will handed off to Fayrene Helper [facility]   Follow up on delta tropins, if both negative and patient is back to his baseline, he can be DC and follow up with cards.             Final Clinical Impression(s) / ED Diagnoses Final diagnoses:  Near syncope  Precordial pain    Rx / DC Orders ED Discharge Orders          Ordered    Ambulatory referral to Cardiology        Comments: If you have not heard from the Cardiology office within the next 72 hours please call [phone].   07/30/22 0649              Carroll Sage, PA-C 07/30/22 1660    Dione Booze, MD 07/30/22 [phone]

## 2022-07-30 NOTE — Discharge Instructions (Signed)
You have been evaluated for your symptoms.  Fortunately no concerning finding were noted on today's exam.  Follow up with cardiology for further assessment of your chest pain. Stay hydrated as your blood pressure is low today.

## 2022-07-30 NOTE — ED Notes (Signed)
Patient transported to CT 

## 2022-07-30 NOTE — ED Provider Notes (Signed)
-  Labs ordered, independently viewed and interpreted by me.  Labs remarkable for normal UDS and normal troponin -The patient was maintained on a cardiac monitor.  I personally viewed and interpreted the cardiac monitored which showed an underlying rhythm of: NSR -Imaging independently viewed and interpreted by me and I agree with radiologist's interpretation.  Result remarkable for head CT unremarkable, old stroke noted.  CXR showing opacity in L lung base which may reflect atelectasis or pneumonia.  Pt however denies fever or cough.  Doubt pna -This patient presents to the ED for concern of AMS, this involves an extensive number of treatment options, and is a complaint that carries with it a high risk of complications and morbidity.  The differential diagnosis includes illicit drug use, ACS, infection, metabolic derangement -Co morbidities that complicate the patient evaluation includes CVA -Treatment includes IVF and food -Reevaluation of the patient after these medicines showed that the patient improved -PCP office notes or outside notes reviewed -Escalation to admission/observation considered: patients feels much better, is comfortable with discharge, and will follow up with PCP -Prescription medication considered, patient comfortable with outpt f/u with cardiology -Social Determinant of Health considered   On recheck pt appears more alert and oriented.  BP soft in the 90s systolic.  Additional IVF given.  Normal lactic acid, normal WBC.  Doubt sepsis.    Fayrene Helper, PA-C 07/30/22 1049    Pricilla Loveless, MD 08/02/22 [phone]

## 2023-08-22 ENCOUNTER — Ambulatory Visit: Admitting: Podiatry

## 2023-09-09 ENCOUNTER — Emergency Department (HOSPITAL_COMMUNITY)

## 2023-09-09 ENCOUNTER — Encounter (HOSPITAL_COMMUNITY): Payer: Self-pay

## 2023-09-09 ENCOUNTER — Emergency Department (HOSPITAL_COMMUNITY)
Admission: EM | Admit: 2023-09-09 | Discharge: 2023-09-10 | Disposition: A | Discharge status: Home / Self Care | Attending: Emergency Medicine | Admitting: Emergency Medicine

## 2023-09-09 ENCOUNTER — Other Ambulatory Visit: Payer: Self-pay

## 2023-09-09 DIAGNOSIS — R7401 Elevation of levels of liver transaminase levels: Secondary | ICD-10-CM | POA: Insufficient documentation

## 2023-09-09 DIAGNOSIS — R4182 Altered mental status, unspecified: Secondary | ICD-10-CM | POA: Diagnosis present

## 2023-09-09 DIAGNOSIS — R569 Unspecified convulsions: Secondary | ICD-10-CM | POA: Diagnosis not present

## 2023-09-09 DIAGNOSIS — Z8673 Personal history of transient ischemic attack (TIA), and cerebral infarction without residual deficits: Secondary | ICD-10-CM | POA: Diagnosis not present

## 2023-09-09 LAB — COMPREHENSIVE METABOLIC PANEL WITH GFR
ALT: 46 U/L — ABNORMAL HIGH (ref 0–44)
AST: 33 U/L (ref 15–41)
Albumin: 3.4 g/dL — ABNORMAL LOW (ref 3.5–5.0)
Alkaline Phosphatase: 73 U/L (ref 38–126)
Anion gap: 10 (ref 5–15)
BUN: <5 mg/dL — ABNORMAL LOW (ref 6–20)
CO2: 20 mmol/L — ABNORMAL LOW (ref 22–32)
Calcium: 8.5 mg/dL — ABNORMAL LOW (ref 8.9–10.3)
Chloride: 107 mmol/L (ref 98–111)
Creatinine, Ser: 0.77 mg/dL (ref 0.61–1.24)
GFR, Estimated: >60 mL/min (ref >60)
Glucose, Bld: 132 mg/dL — ABNORMAL HIGH (ref 70–99)
Potassium: 3.3 mmol/L — ABNORMAL LOW (ref 3.5–5.1)
Sodium: 137 mmol/L (ref 135–145)
Total Bilirubin: 0.3 mg/dL (ref 0.0–1.2)
Total Protein: 6.5 g/dL (ref 6.5–8.1)

## 2023-09-09 LAB — CBC WITH DIFFERENTIAL/PLATELET
Abs Immature Granulocytes: 0.03 K/uL (ref 0.00–0.07)
Basophils Absolute: 0.1 K/uL (ref 0.0–0.1)
Basophils Relative: 1 %
Eosinophils Absolute: 0.1 K/uL (ref 0.0–0.5)
Eosinophils Relative: 1 %
HCT: 42.1 % (ref 39.0–52.0)
Hemoglobin: 13.9 g/dL (ref 13.0–17.0)
Immature Granulocytes: 0 %
Lymphocytes Relative: 23 %
Lymphs Abs: 1.9 K/uL (ref 0.7–4.0)
MCH: 28.2 pg (ref 26.0–34.0)
MCHC: 33 g/dL (ref 30.0–36.0)
MCV: 85.4 fL (ref 80.0–100.0)
Monocytes Absolute: 0.7 K/uL (ref 0.1–1.0)
Monocytes Relative: 9 %
Neutro Abs: 5.4 K/uL (ref 1.7–7.7)
Neutrophils Relative %: 66 %
Platelets: 223 K/uL (ref 150–400)
RBC: 4.93 MIL/uL (ref 4.22–5.81)
RDW: 13 % (ref 11.5–15.5)
WBC: 8.2 K/uL (ref 4.0–10.5)
nRBC: 0 % (ref 0.0–0.2)

## 2023-09-09 LAB — ACETAMINOPHEN LEVEL: Acetaminophen (Tylenol), Serum: <10 ug/mL — ABNORMAL LOW (ref 10–30)

## 2023-09-09 LAB — ETHANOL: Alcohol, Ethyl (B): <15 mg/dL (ref <15)

## 2023-09-09 LAB — CBG MONITORING, ED: Glucose-Capillary: 133 mg/dL — ABNORMAL HIGH (ref 70–99)

## 2023-09-09 LAB — SALICYLATE LEVEL: Salicylate Lvl: <7 mg/dL — ABNORMAL LOW (ref 7.0–30.0)

## 2023-09-09 MED ORDER — SODIUM CHLORIDE 0.9 % IV BOLUS
1000.0000 mL | Freq: Once | INTRAVENOUS | Status: AC
  Administered 2023-09-09: 1000 mL via INTRAVENOUS

## 2023-09-09 NOTE — ED Notes (Signed)
 Jail staff present in room with patient.

## 2023-09-09 NOTE — ED Triage Notes (Signed)
 BIB [facility] from jail. Cell mate reports pt suddenly became rigid with a blank stare. 8 mg Narcan  given via jail staff. No hx seizures. EMS reports pt was initially difficult to rouse but became increasingly rousable en route. Initial HR 150s. 400 mL fluid given en route. Pt amnesic to event, otherwise oriented. Denies pain or taking any substances today.

## 2023-09-09 NOTE — ED Provider Notes (Signed)
 [facility] AT [facility] Provider Note   CSN: [id number] Arrival date & time: 09/09/23  2210     Patient presents with: Altered Mental Status   Garrett West is a 28 y.o. male.   The history is provided by the patient and medical records.  Altered Mental Status  28 year old male with history of prior CVA, presenting to the ED with altered mental status.  Patient is inmate [facility] jail.  Patient had a normal day, has felt fine all day.  Was in his cell with cellmate this evening when all of a sudden he became very rigid with a blank stare on his face and was not responding.  There was no incontinence.  Jail staff attempted 8 mg Narcan  without response.  Upon EMS arrival he was very drowsy and difficult to arouse but has come to and route to the ED.  Initial heart rate was in the 150s but has returned to baseline here after half liter of fluid.  He denies any prior hx of seizures or other neurologic disease.  Denies and drug use or alcohol use today.  Prior to Admission medications   Medication Sig Start Date End Date Taking? Authorizing Provider  ibuprofen  (ADVIL ) 600 MG tablet Take 1 tablet (600 mg total) by mouth every 6 (six) hours as needed for headache, fever, moderate pain or mild pain. Take after meals 08/07/21   Raenelle Coria, MD    Allergies: Patient has no known allergies.    Review of Systems  Neurological:        ? Seizure, AMS  All other systems reviewed and are negative.   Updated Vital Signs BP 96/70   Temp 99.2 F (37.3 C) (Oral)   Resp 18   Ht 5' 4 (1.626 m)   Wt 94.8 kg   SpO2 99%   BMI 35.87 kg/m   Physical Exam Vitals and nursing note reviewed.  Constitutional:      Appearance: He is well-developed.  HENT:     Head: Normocephalic and atraumatic.     Mouth/Throat:     Comments: No tongue or dental injury noted Eyes:     Conjunctiva/sclera: Conjunctivae normal.     Pupils: Pupils are equal, round, and  reactive to light.     Comments: PERRL, conjunctiva are injected without hemorrhage or visible foreign body, EOMs intact, eyes tracking normally  Cardiovascular:     Rate and Rhythm: Normal rate and regular rhythm.     Heart sounds: Normal heart sounds.  Pulmonary:     Effort: Pulmonary effort is normal.     Breath sounds: Normal breath sounds.  Abdominal:     General: Bowel sounds are normal.     Palpations: Abdomen is soft.  Musculoskeletal:        General: Normal range of motion.     Cervical back: Normal range of motion.  Skin:    General: Skin is warm and dry.  Neurological:     Mental Status: He is alert and oriented to person, place, and time.     Comments: AAOx3, answering questions and following commands appropriately; equal strength UE and LE bilaterally; CN grossly intact; moves all extremities appropriately without ataxia; no focal neuro deficits or facial asymmetry appreciated     (all labs ordered are listed, but only abnormal results are displayed) Labs Reviewed  COMPREHENSIVE METABOLIC PANEL WITH GFR - Abnormal; Notable for the following components:      Result Value   Potassium  3.3 (*)    CO2 20 (*)    Glucose, Bld 132 (*)    BUN <5 (*)    Calcium 8.5 (*)    Albumin 3.4 (*)    ALT 46 (*)    All other components within normal limits  SALICYLATE LEVEL - Abnormal; Notable for the following components:   Salicylate Lvl <7.0 (*)    All other components within normal limits  ACETAMINOPHEN  LEVEL - Abnormal; Notable for the following components:   Acetaminophen  (Tylenol ), Serum <10 (*)    All other components within normal limits  CBG MONITORING, ED - Abnormal; Notable for the following components:   Glucose-Capillary 133 (*)    All other components within normal limits  CBC WITH DIFFERENTIAL/PLATELET  ETHANOL  RAPID URINE DRUG SCREEN, HOSP PERFORMED    EKG: None  Radiology: CT Head Wo Contrast Result Date: 09/09/2023 CLINICAL DATA:  Seizure, new-onset,  history of trauma EXAM: CT HEAD WITHOUT CONTRAST TECHNIQUE: Contiguous axial images were obtained from the base of the skull through the vertex without intravenous contrast. RADIATION DOSE REDUCTION: This exam was performed according to the departmental dose-optimization program which includes automated exposure control, adjustment of the mA and/or kV according to patient size and/or use of iterative reconstruction technique. COMPARISON:  Head CT 07/30/2022 FINDINGS: Brain: No evidence of acute infarction, hemorrhage, hydrocephalus, extra-axial collection or mass lesion/mass effect. Stable lacunar infarcts in the basal ganglia. Incidental cavum septum pellucidum. Vascular: No hyperdense vessel or unexpected calcification. Skull: No fracture or focal lesion. Sinuses/Orbits: Paranasal sinuses and mastoid air cells are clear. The visualized orbits are unremarkable. Other: Unremarkable scalp soft tissues. IMPRESSION: 1. No acute intracranial abnormality. 2. Unchanged remote lacunar infarcts in the basal ganglia. Electronically Signed   By: Andrea Gasman M.D.   On: 09/09/2023 22:57   DG Chest Port 1 View Result Date: 09/09/2023 CLINICAL DATA:  AMS EXAM: PORTABLE CHEST 1 VIEW COMPARISON:  Chest radiograph 07/30/2022 FINDINGS: The cardiomediastinal contours are normal. The lungs are clear. Pulmonary vasculature is normal. No consolidation, pleural effusion, or pneumothorax. No acute osseous abnormalities are seen. IMPRESSION: No active disease. Electronically Signed   By: Andrea Gasman M.D.   On: 09/09/2023 22:52     Procedures   Medications Ordered in the ED - No data to display                                  Medical Decision Making Amount and/or Complexity of Data Reviewed Labs: ordered. Radiology: ordered and independent interpretation performed. ECG/medicine tests: ordered and independent interpretation performed.   28 year old male presenting to the ED after episode of altered mental  status.  Was in his cell when cellmate noted that he dropped to the ground and had a blank stare.  No noted seizure activity, incontinence.  Sounds like he was fairly drowsy and largely unresponsive with EMS but started to arouse and route.  He denies any prior history of seizures.  Has had prior stroke.  He is awake, alert, oriented here.  He is hemodynamically stable.  He is able to answer questions and follow commands without difficulty.  He does not have any focal neurologic deficits.  No evidence of tongue or dental injury.  Will obtain CT head, chest x-ray, screening labs including UDS.  Labs as above--no leukocytosis.  Trivially low potassium at 3.3.  Minor elevation of ALT but this is largely improved from prior values.  Negative  Tylenol , salicylate, and ethanol levels.  UDS is pending.  CT head with prior lacunar infarcts but no acute findings.  Chest x-ray is clear.  Patient may have had seizure.  12:28 AM Spoke with neurology-- Dr. Vanessa.  He has reviewed labs and imaging from today's visit as well as prior MRI's.  Prior CVA's not in territory that should be causing seizures.  Agrees with UDS. No need for MRI. Would not start anti-epileptics based on single episode today.  Will need OP work-up with neurology including EEG.  If any recurrent activity in the future suspicious for seizure PRIOR to OP follow-up would recommend admission with EEG at that time.  UDS is negative.  Patient remains awake, alert, hemodynamically stable.  I have discussed results and care plan as per neurology.  Will place ambulatory referral for outpatient follow-up.  Can return here for new concerns.  Final diagnoses:  Seizure-like activity [facility])    ED Discharge Orders          Ordered    Ambulatory referral to Neurology       Comments: An appointment is requested in approximately: 4 weeks   09/10/23 0055               Jarold Olam HERO, PA-C 09/10/23 0106    Emil Share, DO 09/10/23 [phone]

## 2023-09-10 LAB — RAPID URINE DRUG SCREEN, HOSP PERFORMED
Amphetamines: NOT DETECTED
Barbiturates: NOT DETECTED
Benzodiazepines: NOT DETECTED
Cocaine: NOT DETECTED
Opiates: NOT DETECTED
Tetrahydrocannabinol: NOT DETECTED

## 2023-09-10 NOTE — Discharge Instructions (Signed)
 Your workup today was overall reassuring including labs and CT head-- copies sent for review. Case has been discussed with our neurologist-- did not feel that antiepileptics needed to be initiated after single episode today.  We have placed an ambulatory referral to neurology for outpatient follow-up with EEG. If recurrent seizure-like activity prior to outpatient visit, will need to return to the hospital for admission and inpatient workup at that time.

## 2023-09-28 ENCOUNTER — Observation Stay (HOSPITAL_COMMUNITY)

## 2023-09-28 ENCOUNTER — Other Ambulatory Visit: Payer: Self-pay

## 2023-09-28 ENCOUNTER — Encounter (HOSPITAL_COMMUNITY): Payer: Self-pay | Admitting: Internal Medicine

## 2023-09-28 ENCOUNTER — Observation Stay (HOSPITAL_COMMUNITY): Admission: EM | Admit: 2023-09-28 | Discharge: 2023-09-29 | Attending: Family Medicine | Admitting: Family Medicine

## 2023-09-28 ENCOUNTER — Emergency Department (HOSPITAL_COMMUNITY)

## 2023-09-28 DIAGNOSIS — E785 Hyperlipidemia, unspecified: Secondary | ICD-10-CM | POA: Diagnosis present

## 2023-09-28 DIAGNOSIS — W06XXXA Fall from bed, initial encounter: Secondary | ICD-10-CM | POA: Diagnosis not present

## 2023-09-28 DIAGNOSIS — E782 Mixed hyperlipidemia: Secondary | ICD-10-CM | POA: Diagnosis not present

## 2023-09-28 DIAGNOSIS — S0083XA Contusion of other part of head, initial encounter: Secondary | ICD-10-CM

## 2023-09-28 DIAGNOSIS — W19XXXA Unspecified fall, initial encounter: Secondary | ICD-10-CM | POA: Diagnosis not present

## 2023-09-28 DIAGNOSIS — S0181XA Laceration without foreign body of other part of head, initial encounter: Secondary | ICD-10-CM | POA: Diagnosis not present

## 2023-09-28 DIAGNOSIS — Z8673 Personal history of transient ischemic attack (TIA), and cerebral infarction without residual deficits: Secondary | ICD-10-CM | POA: Diagnosis not present

## 2023-09-28 DIAGNOSIS — F39 Unspecified mood [affective] disorder: Secondary | ICD-10-CM | POA: Insufficient documentation

## 2023-09-28 DIAGNOSIS — Z8659 Personal history of other mental and behavioral disorders: Secondary | ICD-10-CM | POA: Insufficient documentation

## 2023-09-28 DIAGNOSIS — G40409 Other generalized epilepsy and epileptic syndromes, not intractable, without status epilepticus: Secondary | ICD-10-CM | POA: Diagnosis not present

## 2023-09-28 DIAGNOSIS — F1911 Other psychoactive substance abuse, in remission: Secondary | ICD-10-CM | POA: Diagnosis present

## 2023-09-28 DIAGNOSIS — R569 Unspecified convulsions: Principal | ICD-10-CM | POA: Insufficient documentation

## 2023-09-28 LAB — CBC WITH DIFFERENTIAL/PLATELET
Abs Immature Granulocytes: 0.07 K/uL (ref 0.00–0.07)
Basophils Absolute: 0 K/uL (ref 0.0–0.1)
Basophils Relative: 0 %
Eosinophils Absolute: 0.1 K/uL (ref 0.0–0.5)
Eosinophils Relative: 1 %
HCT: 47.3 % (ref 39.0–52.0)
Hemoglobin: 15.4 g/dL (ref 13.0–17.0)
Immature Granulocytes: 1 %
Lymphocytes Relative: 24 %
Lymphs Abs: 2.4 K/uL (ref 0.7–4.0)
MCH: 27.8 pg (ref 26.0–34.0)
MCHC: 32.6 g/dL (ref 30.0–36.0)
MCV: 85.5 fL (ref 80.0–100.0)
Monocytes Absolute: 0.7 K/uL (ref 0.1–1.0)
Monocytes Relative: 7 %
Neutro Abs: 6.4 K/uL (ref 1.7–7.7)
Neutrophils Relative %: 67 %
Platelets: 257 K/uL (ref 150–400)
RBC: 5.53 MIL/uL (ref 4.22–5.81)
RDW: 13.2 % (ref 11.5–15.5)
WBC: 9.7 K/uL (ref 4.0–10.5)
nRBC: 0 % (ref 0.0–0.2)

## 2023-09-28 LAB — COMPREHENSIVE METABOLIC PANEL WITH GFR
ALT: 39 U/L (ref 0–44)
AST: 38 U/L (ref 15–41)
Albumin: 4 g/dL (ref 3.5–5.0)
Alkaline Phosphatase: 90 U/L (ref 38–126)
Anion gap: 13 (ref 5–15)
BUN: 6 mg/dL (ref 6–20)
CO2: 20 mmol/L — ABNORMAL LOW (ref 22–32)
Calcium: 9.3 mg/dL (ref 8.9–10.3)
Chloride: 104 mmol/L (ref 98–111)
Creatinine, Ser: 0.94 mg/dL (ref 0.61–1.24)
GFR, Estimated: >60 mL/min (ref >60)
Glucose, Bld: 108 mg/dL — ABNORMAL HIGH (ref 70–99)
Potassium: 4.2 mmol/L (ref 3.5–5.1)
Sodium: 137 mmol/L (ref 135–145)
Total Bilirubin: 0.6 mg/dL (ref 0.0–1.2)
Total Protein: 6.3 g/dL — ABNORMAL LOW (ref 6.5–8.1)

## 2023-09-28 LAB — AMMONIA: Ammonia: 42 umol/L — ABNORMAL HIGH (ref 9–35)

## 2023-09-28 LAB — RAPID URINE DRUG SCREEN, HOSP PERFORMED
Amphetamines: NOT DETECTED
Barbiturates: NOT DETECTED
Benzodiazepines: NOT DETECTED
Cocaine: NOT DETECTED
Opiates: NOT DETECTED
Tetrahydrocannabinol: NOT DETECTED

## 2023-09-28 LAB — HIV ANTIBODY (ROUTINE TESTING W REFLEX): HIV Screen 4th Generation wRfx: NONREACTIVE

## 2023-09-28 LAB — MAGNESIUM: Magnesium: 2 mg/dL (ref 1.7–2.4)

## 2023-09-28 MED ORDER — ACETAMINOPHEN 325 MG PO TABS: 650.0000 mg | ORAL_TABLET | Freq: Four times a day (QID) | ORAL | Status: DC | PRN

## 2023-09-28 MED ORDER — PHENYTOIN 50 MG PO CHEW
100.0000 mg | CHEWABLE_TABLET | Freq: Three times a day (TID) | ORAL | Status: DC
  Administered 2023-09-28 – 2023-09-29 (×3): 100 mg via ORAL
  Filled 2023-09-28 (×4): qty 2

## 2023-09-28 MED ORDER — SODIUM CHLORIDE 0.9% FLUSH
3.0000 mL | Freq: Two times a day (BID) | INTRAVENOUS | Status: DC
  Administered 2023-09-29: 3 mL via INTRAVENOUS

## 2023-09-28 MED ORDER — ENOXAPARIN SODIUM 40 MG/0.4ML IJ SOSY
40.0000 mg | PREFILLED_SYRINGE | INTRAMUSCULAR | Status: DC
  Administered 2023-09-28 – 2023-09-29 (×2): 40 mg via SUBCUTANEOUS
  Filled 2023-09-28 (×2): qty 0.4

## 2023-09-28 MED ORDER — SODIUM CHLORIDE 0.9 % IV SOLN: Freq: Once | INTRAVENOUS | Status: AC

## 2023-09-28 MED ORDER — PHENYTOIN 50 MG PO CHEW
100.0000 mg | CHEWABLE_TABLET | Freq: Three times a day (TID) | ORAL | Status: DC
Start: 2023-09-28 — End: 2023-09-28

## 2023-09-28 MED ORDER — ACETAMINOPHEN 650 MG RE SUPP: 650.0000 mg | Freq: Four times a day (QID) | RECTAL | Status: DC | PRN

## 2023-09-28 MED ORDER — GADOBUTROL 1 MMOL/ML IV SOLN
9.0000 mL | Freq: Once | INTRAVENOUS | Status: AC | PRN
  Administered 2023-09-28: 9 mL via INTRAVENOUS

## 2023-09-28 MED ORDER — SODIUM CHLORIDE 0.9 % IV SOLN
1500.0000 mg | Freq: Once | INTRAVENOUS | Status: AC
Start: 2023-09-28 — End: 2023-09-28
  Administered 2023-09-28: 1500 mg via INTRAVENOUS
  Filled 2023-09-28: qty 30

## 2023-09-28 MED ORDER — MIDAZOLAM HCL 2 MG/2ML IJ SOLN: 2.0000 mg | INTRAMUSCULAR | Status: DC | PRN

## 2023-09-28 MED ORDER — LIDOCAINE-EPINEPHRINE (PF) 2 %-1:200000 IJ SOLN
10.0000 mL | Freq: Once | INTRAMUSCULAR | Status: AC
  Administered 2023-09-28: 10 mL via INTRADERMAL
  Filled 2023-09-28: qty 20

## 2023-09-28 MED ORDER — ONDANSETRON HCL 4 MG PO TABS: 4.0000 mg | ORAL_TABLET | Freq: Four times a day (QID) | ORAL | Status: DC | PRN

## 2023-09-28 MED ORDER — ARIPIPRAZOLE 5 MG PO TABS
10.0000 mg | ORAL_TABLET | Freq: Every day | ORAL | Status: DC
  Administered 2023-09-28: 10 mg via ORAL
  Filled 2023-09-28: qty 2

## 2023-09-28 MED ORDER — ACETAMINOPHEN 325 MG PO TABS
650.0000 mg | ORAL_TABLET | Freq: Once | ORAL | Status: AC
  Administered 2023-09-28: 650 mg via ORAL
  Filled 2023-09-28: qty 2

## 2023-09-28 MED ORDER — ALBUTEROL SULFATE (2.5 MG/3ML) 0.083% IN NEBU: 2.5000 mg | INHALATION_SOLUTION | Freq: Four times a day (QID) | RESPIRATORY_TRACT | Status: DC | PRN

## 2023-09-28 MED ORDER — BACITRACIN ZINC 500 UNIT/GM EX OINT
TOPICAL_OINTMENT | Freq: Two times a day (BID) | CUTANEOUS | Status: DC
  Administered 2023-09-28 (×3): 1 via TOPICAL
  Administered 2023-09-29: 31.5 via TOPICAL
  Filled 2023-09-28 (×2): qty 0.9
  Filled 2023-09-28: qty 28.4

## 2023-09-28 MED ORDER — GEMFIBROZIL 600 MG PO TABS
600.0000 mg | ORAL_TABLET | Freq: Two times a day (BID) | ORAL | Status: DC
  Administered 2023-09-28 – 2023-09-29 (×3): 600 mg via ORAL
  Filled 2023-09-28 (×4): qty 1

## 2023-09-28 MED ORDER — SODIUM CHLORIDE 0.9 % IV SOLN: INTRAVENOUS | Status: AC

## 2023-09-28 MED ORDER — ONDANSETRON HCL 4 MG/2ML IJ SOLN: 4.0000 mg | Freq: Four times a day (QID) | INTRAMUSCULAR | Status: DC | PRN

## 2023-09-28 MED ORDER — TETANUS-DIPHTH-ACELL PERTUSSIS 5-2.5-18.5 LF-MCG/0.5 IM SUSY
0.5000 mL | PREFILLED_SYRINGE | Freq: Once | INTRAMUSCULAR | Status: AC
  Administered 2023-09-28: 0.5 mL via INTRAMUSCULAR
  Filled 2023-09-28: qty 0.5

## 2023-09-28 MED ORDER — LIDOCAINE-EPINEPHRINE-TETRACAINE (LET) TOPICAL GEL
3.0000 mL | Freq: Once | TOPICAL | Status: AC
  Administered 2023-09-28: 3 mL via TOPICAL
  Filled 2023-09-28: qty 3

## 2023-09-28 MED ORDER — LEVETIRACETAM (KEPPRA) 500 MG/5 ML ADULT IV PUSH
2000.0000 mg | Freq: Once | INTRAVENOUS | Status: DC
Start: 2023-09-28 — End: 2023-09-28

## 2023-09-28 NOTE — Progress Notes (Signed)
 Per rn pt is back from MRI.

## 2023-09-28 NOTE — Progress Notes (Signed)
 Attempted to do EEG, but pt is in MRI currently.

## 2023-09-28 NOTE — Procedures (Signed)
 Patient Name: Garrett West  MRN: [id number]  Epilepsy Attending: Arlin MALVA Krebs  Referring Physician/Provider: Merrianne Locus, MD  Date: 09/28/2023 Duration: 22.09 mins  Patient history: 28 y.o. male with a PMHx of substance-related multifocal strokes who [facility] ED from jail after a GTC seizure there, with witnessed portion lasting for about 2 minutes.  EEG to evaluate for seizure  Level of alertness: Awake  AEDs during EEG study: PHT  Technical aspects: This EEG study was done with scalp electrodes positioned according to the 10-20 International system of electrode placement. Electrical activity was reviewed with band pass filter of 1-70Hz , sensitivity of 7 uV/mm, display speed of 80mm/sec with a 60Hz  notched filter applied as appropriate. EEG data were recorded continuously and digitally stored.  Video monitoring was available and reviewed as appropriate.  Description: The posterior dominant rhythm consists of 8 Hz activity of moderate voltage (25-35 uV) seen predominantly in posterior head regions, symmetric and reactive to eye opening and eye closing. Hyperventilation and photic stimulation were not performed.     IMPRESSION: This study is within normal limits. No seizures or epileptiform discharges were seen throughout the recording.  A normal interictal EEG does not exclude the diagnosis of epilepsy.   Emnet Monk O Deziray Nabi

## 2023-09-28 NOTE — ED Notes (Signed)
 Patient transported to MRI

## 2023-09-28 NOTE — ED Provider Notes (Signed)
 Garrett West   CSN: [id number] Arrival date & time: 09/28/23  0050     Patient presents with: Seizures   Garrett West is a 28 y.o. male.   The history is provided by the patient and medical records.  Seizures Garrett West is a 28 y.o. male who presents to the Emergency Department complaining of seizure. He presents the emergency department by EMS from [facility] jail for evaluation following witness seizure. He was on the top bunk and he fell to the floor and had about one minute of generalized seizure activity. He did strike his head. It is unclear if he fell and then ceased or if he seized and then fell. He was mildly postictal for EMS. No reported history of seizure. He denies any medical problems. No tobacco, alcohol, drug use. He has been incarcerated for about a year.      Prior to Admission medications   Medication Sig Start Date End Date Taking? Authorizing Provider  ARIPiprazole  (ABILIFY ) 10 MG tablet Take 10 mg by mouth at bedtime. For 120 days   Yes [provider]  gemfibrozil  (LOPID ) 600 MG tablet Take 600 mg by mouth 2 (two) times daily before a meal. For 90 days   Yes [provider]    Allergies: Patient has no known allergies.    Review of Systems  Neurological:  Positive for seizures.  All other systems reviewed and are negative.   Updated Vital Signs BP 108/77   Pulse 98   Temp 98.4 F (36.9 C) (Oral)   Resp 20   Ht 5' 4 (1.626 m)   Wt 94 kg   SpO2 95%   BMI 35.57 kg/m   Physical Exam Vitals and nursing West reviewed.  Constitutional:      Appearance: He is well-developed.  HENT:     Head: Normocephalic.     Comments: L-shaped laceration to the central forehead. There is dried blood in bilateral nares. Pupils equal round and reactive, EOMI. Cardiovascular:     Rate and Rhythm: Regular rhythm. Tachycardia present.     Heart sounds: No murmur  heard. Pulmonary:     Effort: Pulmonary effort is normal. No respiratory distress.     Breath sounds: Normal breath sounds.  Abdominal:     Palpations: Abdomen is soft.     Tenderness: There is no abdominal tenderness. There is no guarding or rebound.  Musculoskeletal:        General: No tenderness.  Skin:    General: Skin is warm and dry.  Neurological:     Mental Status: He is alert and oriented to person, place, and time.     Comments: No asymmetry of facial movements.  5/5 strength in all four extremities with sensation to light touch intact in all four extremities.   Psychiatric:        Behavior: Behavior normal.     (all labs ordered are listed, but only abnormal results are displayed) Labs Reviewed  COMPREHENSIVE METABOLIC PANEL WITH GFR - Abnormal; Notable for the following components:      Result Value   CO2 20 (*)    Glucose, Bld 108 (*)    Total Protein 6.3 (*)    All other components within normal limits  CBC WITH DIFFERENTIAL/PLATELET  MAGNESIUM  RAPID URINE DRUG SCREEN, HOSP PERFORMED    EKG: EKG Interpretation Date/Time:  Wednesday September 28 2023 00:53:49 EDT Ventricular Rate:  111 PR Interval:  155  QRS Duration:  96 QT Interval:  331 QTC Calculation: 450 R Axis:   151  Text Interpretation: Sinus tachycardia Probable right ventricular hypertrophy ST elev, probable normal early repol pattern Confirmed by Griselda Norris [phone]) on 09/28/2023 1:17:19 AM  Radiology: CT Maxillofacial WO CM Result Date: 09/28/2023 CLINICAL DATA:  Facial trauma, blunt; Head trauma, abnormal mental status (Age 40-64y) seizure; Neck trauma, dangerous injury mechanism (Age 50-64y) EXAM: CT HEAD WITHOUT CONTRAST CT MAXILLOFACIAL WITHOUT CONTRAST CT CERVICAL SPINE WITHOUT CONTRAST TECHNIQUE: Multidetector CT imaging of the head, cervical spine, and maxillofacial structures were performed using the standard protocol without intravenous contrast. Multiplanar CT image reconstructions  of the cervical spine and maxillofacial structures were also generated. RADIATION DOSE REDUCTION: This exam was performed according to the departmental dose-optimization program which includes automated exposure control, adjustment of the mA and/or kV according to patient size and/or use of iterative reconstruction technique. COMPARISON:  None Available. FINDINGS: CT HEAD FINDINGS Brain: No evidence of acute infarction, hemorrhage, hydrocephalus, extra-axial collection or mass lesion/mass effect. Remote right basal ganglia lacunar infarct. Vascular: No hyperdense vessel or unexpected calcification. Skull: No acute fracture.  Left forehead contusion. Other: No mastoid effusions. CT MAXILLOFACIAL FINDINGS Osseous: No fracture or mandibular dislocation. No destructive process. Orbits: Negative. No traumatic or inflammatory finding. Sinuses: Mostly clear.  No air-fluid levels. Soft tissues: Left forehead contusion. CT CERVICAL SPINE FINDINGS Alignment: Normal. Skull base and vertebrae: No acute fracture. No primary bone lesion or focal pathologic process. Soft tissues and spinal canal: No prevertebral fluid or swelling. No visible canal hematoma. Disc levels:  No significant bony degenerative change. Upper chest: Lung apices are clear. IMPRESSION: 1. No evidence of acute intracranial abnormality. 2. No acute fracture or traumatic malalignment in the cervical spine. 3. No acute facial fracture. Left forehead contusion. Electronically Signed   By: Gilmore GORMAN Molt M.D.   On: 09/28/2023 03:02   CT Head Wo Contrast Result Date: 09/28/2023 CLINICAL DATA:  Facial trauma, blunt; Head trauma, abnormal mental status (Age 72-64y) seizure; Neck trauma, dangerous injury mechanism (Age 19-64y) EXAM: CT HEAD WITHOUT CONTRAST CT MAXILLOFACIAL WITHOUT CONTRAST CT CERVICAL SPINE WITHOUT CONTRAST TECHNIQUE: Multidetector CT imaging of the head, cervical spine, and maxillofacial structures were performed using the standard protocol  without intravenous contrast. Multiplanar CT image reconstructions of the cervical spine and maxillofacial structures were also generated. RADIATION DOSE REDUCTION: This exam was performed according to the departmental dose-optimization program which includes automated exposure control, adjustment of the mA and/or kV according to patient size and/or use of iterative reconstruction technique. COMPARISON:  None Available. FINDINGS: CT HEAD FINDINGS Brain: No evidence of acute infarction, hemorrhage, hydrocephalus, extra-axial collection or mass lesion/mass effect. Remote right basal ganglia lacunar infarct. Vascular: No hyperdense vessel or unexpected calcification. Skull: No acute fracture.  Left forehead contusion. Other: No mastoid effusions. CT MAXILLOFACIAL FINDINGS Osseous: No fracture or mandibular dislocation. No destructive process. Orbits: Negative. No traumatic or inflammatory finding. Sinuses: Mostly clear.  No air-fluid levels. Soft tissues: Left forehead contusion. CT CERVICAL SPINE FINDINGS Alignment: Normal. Skull base and vertebrae: No acute fracture. No primary bone lesion or focal pathologic process. Soft tissues and spinal canal: No prevertebral fluid or swelling. No visible canal hematoma. Disc levels:  No significant bony degenerative change. Upper chest: Lung apices are clear. IMPRESSION: 1. No evidence of acute intracranial abnormality. 2. No acute fracture or traumatic malalignment in the cervical spine. 3. No acute facial fracture. Left forehead contusion. Electronically Signed   By: Gilmore GORMAN Molt CHRISTELLA.D.  On: 09/28/2023 03:02   CT Cervical Spine Wo Contrast Result Date: 09/28/2023 CLINICAL DATA:  Facial trauma, blunt; Head trauma, abnormal mental status (Age 46-64y) seizure; Neck trauma, dangerous injury mechanism (Age 36-64y) EXAM: CT HEAD WITHOUT CONTRAST CT MAXILLOFACIAL WITHOUT CONTRAST CT CERVICAL SPINE WITHOUT CONTRAST TECHNIQUE: Multidetector CT imaging of the head, cervical  spine, and maxillofacial structures were performed using the standard protocol without intravenous contrast. Multiplanar CT image reconstructions of the cervical spine and maxillofacial structures were also generated. RADIATION DOSE REDUCTION: This exam was performed according to the departmental dose-optimization program which includes automated exposure control, adjustment of the mA and/or kV according to patient size and/or use of iterative reconstruction technique. COMPARISON:  None Available. FINDINGS: CT HEAD FINDINGS Brain: No evidence of acute infarction, hemorrhage, hydrocephalus, extra-axial collection or mass lesion/mass effect. Remote right basal ganglia lacunar infarct. Vascular: No hyperdense vessel or unexpected calcification. Skull: No acute fracture.  Left forehead contusion. Other: No mastoid effusions. CT MAXILLOFACIAL FINDINGS Osseous: No fracture or mandibular dislocation. No destructive process. Orbits: Negative. No traumatic or inflammatory finding. Sinuses: Mostly clear.  No air-fluid levels. Soft tissues: Left forehead contusion. CT CERVICAL SPINE FINDINGS Alignment: Normal. Skull base and vertebrae: No acute fracture. No primary bone lesion or focal pathologic process. Soft tissues and spinal canal: No prevertebral fluid or swelling. No visible canal hematoma. Disc levels:  No significant bony degenerative change. Upper chest: Lung apices are clear. IMPRESSION: 1. No evidence of acute intracranial abnormality. 2. No acute fracture or traumatic malalignment in the cervical spine. 3. No acute facial fracture. Left forehead contusion. Electronically Signed   By: Gilmore GORMAN Molt M.D.   On: 09/28/2023 03:02     .Laceration Repair  Date/Time: 09/28/2023 2:18 AM  Performed by: Griselda Norris, MD Authorized by: Griselda Norris, MD   Consent:    Consent obtained:  Verbal   Consent given by:  Patient   Risks discussed:  Infection, pain and poor cosmetic result   Alternatives  discussed:  Observation Universal protocol:    Patient identity confirmed:  Verbally with patient Anesthesia:    Anesthesia method:  Topical application and local infiltration   Topical anesthetic:  LET   Local anesthetic:  Lidocaine  2% WITH epi Laceration details:    Location:  Face   Face location:  Forehead   Length (cm):  4 Exploration:    Hemostasis achieved with:  Direct pressure   Imaging outcome: foreign body not noted     Wound exploration: wound explored through full range of motion     Contaminated: no   Treatment:    Area cleansed with:  Chlorhexidine  and saline   Amount of cleaning:  Standard   Debridement:  None Skin repair:    Repair method:  Sutures   Suture size:  4-0   Suture material:  Prolene   Suture technique:  Simple interrupted   Number of sutures:  6 Approximation:    Approximation:  Close Repair type:    Repair type:  Simple Post-procedure details:    Dressing:  Antibiotic ointment and adhesive bandage   Procedure completion:  Tolerated well, no immediate complications    Medications Ordered in the ED  bacitracin  ointment (1 Application Topical Given 09/28/23 0508)  lidocaine -EPINEPHrine -tetracaine  (LET) topical gel (3 mLs Topical Given 09/28/23 0113)  lidocaine -EPINEPHrine  (XYLOCAINE  W/EPI) 2 %-1:200000 (PF) injection 10 mL (10 mLs Intradermal Given 09/28/23 0200)  0.9 %  sodium chloride  infusion (0 mLs Intravenous Stopped 09/28/23 0324)  acetaminophen  (TYLENOL ) tablet 650 mg (  650 mg Oral Given 09/28/23 0507)  Tdap (BOOSTRIX ) injection 0.5 mL (0.5 mLs Intramuscular Given 09/28/23 0507)                                    Medical Decision Making Amount and/or Complexity of Data Reviewed Labs: ordered. Radiology: ordered.  Risk OTC drugs. Prescription drug management. Decision regarding hospitalization.   Patient with history of remote CVA in the setting of substance use here for evaluation of seizure like activity, head injury. Patient has  no focal neurologic deficits at this time. He does have a forehead laceration. This was repaired per West. CT head, neck and face are negative for acute abnormality. Labs are unremarkable. Tenderness was updated. Given this is the second seizure like episode in the last two weeks plan to admit for observation. Discussed with Dr. Lindzen with neurology, who will see the patient and consult. Hospitals consulted for admission.     Final diagnoses:  Seizure-like activity [facility])  Facial laceration, initial encounter    ED Discharge Orders     None          Griselda Norris, MD 09/28/23 [phone]

## 2023-09-28 NOTE — ED Notes (Signed)
Wound cleaned and irrigated with normal saline.

## 2023-09-28 NOTE — ED Triage Notes (Signed)
 Patient BIB [facility] from jail due to possible seizure. Patient does not remember event, denies pain. Staff heard thud and saw patient convulsing lasting about 2 minutes. Possible hx of seizures, but unknown. 2-3 in lac according to EMS due to falling from top bunk. VSS w/ EMS.

## 2023-09-28 NOTE — Progress Notes (Signed)
 EEG complete. Results pending.  ?

## 2023-09-28 NOTE — ED Notes (Signed)
 Patient transported to CT

## 2023-09-28 NOTE — Plan of Care (Addendum)
 Seizure precautions in place: 4 siderails up with pads on all 4 rails, suction cannister w/ tubing & yaunkeur connected to wall suction, supplemental oxygen mask & nasal cannula available if needed. Pt is admitted from department of corrects, one guard at bedside. Urinal & ice water within reach of pt on bedside table. 1 left leg shackle in place as forensic restraint. No noted circulation or skin compromise at site of metal cuff. Call bell functions to alert staff of patient needs but does not function to operate television. Call bell replaced. Fall precautions in place. No noted seizure activity overnight.   Problem: Education: Goal: Knowledge of General Education information will improve Description: Including pain rating scale, medication(s)/side effects and non-pharmacologic comfort measures Outcome: Progressing   Problem: Health Behavior/Discharge Planning: Goal: Ability to manage health-related needs will improve Outcome: Progressing   Problem: Clinical Measurements: Goal: Ability to maintain clinical measurements within normal limits will improve Outcome: Progressing Goal: Will remain free from infection Outcome: Progressing Goal: Diagnostic test results will improve Outcome: Progressing Goal: Respiratory complications will improve Outcome: Progressing Goal: Cardiovascular complication will be avoided Outcome: Progressing   Problem: Activity: Goal: Risk for activity intolerance will decrease Outcome: Progressing   Problem: Nutrition: Goal: Adequate nutrition will be maintained Outcome: Progressing   Problem: Coping: Goal: Level of anxiety will decrease Outcome: Progressing   Problem: Elimination: Goal: Will not experience complications related to bowel motility Outcome: Progressing Goal: Will not experience complications related to urinary retention Outcome: Progressing   Problem: Pain Managment: Goal: General experience of comfort will improve and/or be  controlled Outcome: Progressing   Problem: Safety: Goal: Ability to remain free from injury will improve Outcome: Progressing   Problem: Skin Integrity: Goal: Risk for impaired skin integrity will decrease Outcome: Progressing

## 2023-09-28 NOTE — H&P (Signed)
 History and Physical    Patient: Garrett West FMW:[id number] DOB: 07-18-1995 DOA: 09/28/2023 DOS: the patient was seen and examined on 09/28/2023 PCP: Pcp, No  Patient coming from: [place] via EMS  Chief Complaint:  Chief Complaint  Patient presents with   Seizures   HPI: Garrett West is a 28 y.o. male with medical history significant of substance related multifocal stroke in 2022 presents after having a suspected seizure.  He was found on the ground after likely falling from the second level bunk bed.  It was reported that he was witnessed having 2 to 3 minutes of generalized tonic clonic seizure activity while on the ground.  He does not recall the event and only remembers waking up on the ground.  Review of records note patient had a similar episode on 8/29 for which he was brought to the hospital, but workup including CT head and lab work were negative and no antiseizure medication was recommended at that time.  Approximately two years ago, he experienced a stroke that initially resulted in left arm weakness, which has since resolved. He denies any current residual weakness.  He is unsure of the purpose of Abilify  but knows the other medication is for cholesterol.  There is no history of smoking or alcohol use.  However, the patient's guard notes that he had been found with K2 contraband which is a synthetic marijuana while at the facility before.   In the ED patient was noted to be afebrile with pulse elevated up to 119, respirations 28, blood pressures 89/60-117/73, and O2 saturations currently maintained on room air.  Labs noted to be relatively within normal limits.  CT scan of the head, maxillofacial, and cervical spine did not reveal any acute abnormality.  Patient sustained a laceration to his forehead which was repaired by the ED provider with sutures.  Neurology had been consulted with recommendations for EEG, MRI of the brain with and without contrast, and started  patient on antiseizure medication.  Review of Systems: As mentioned in the history of present illness. All other systems reviewed and are negative. No past medical history on file. Past Surgical History:  Procedure Laterality Date   I & D EXTREMITY Right 08/05/2021   Procedure: IRRIGATION AND DEBRIDEMENT RIGHT INDEX FINGER;  Surgeon: Romona Harari, MD;  Location: [facility] OR;  Service: Orthopedics;  Laterality: Right;   Social History:  reports that he has never smoked. He has never used smokeless tobacco. He reports that he does not currently use alcohol. He reports current drug use. Drugs: Methamphetamines and Marijuana.  No Known Allergies  No family history on file.  Prior to Admission medications   Medication Sig Start Date End Date Taking? Authorizing Provider  ARIPiprazole  (ABILIFY ) 10 MG tablet Take 10 mg by mouth at bedtime. For 120 days   Yes [provider]  gemfibrozil  (LOPID ) 600 MG tablet Take 600 mg by mouth 2 (two) times daily before a meal. For 90 days   Yes [provider]    Physical Exam: Vitals:   09/28/23 0715 09/28/23 0730 09/28/23 0745 09/28/23 0800  BP: 115/84 94/67 95/70  102/73  Pulse: 94 90 81 88  Resp: 19 17 14  (!) 22  Temp:      TempSrc:      SpO2: 92% 92% 92% 95%  Weight:      Height:        Constitutional: Young male currently in no acute distress Eyes: PERRL, lids and conjunctivae normal.  ENMT: Mucous membranes are  moist. Posterior pharynx clear of any exudate or lesions.Normal dentition.  Neck: normal, supple, no masses, no thyromegaly Respiratory: clear to auscultation bilaterally, no wheezing, no crackles. Normal respiratory effort. No accessory muscle use.  Cardiovascular: Regular rate and rhythm, no murmurs / rubs / gallops. No extremity edema. 2+ pedal pulses. No carotid bruits.  Abdomen: no tenderness, no masses palpated. No hepatosplenomegaly. Bowel sounds positive.  Musculoskeletal: no clubbing / cyanosis. No joint  deformity upper and lower extremities. Good ROM, no contractures. Normal muscle tone.  Skin: Laceration sutured above left side of forehead Neurologic: CN 2-12 grossly intact. Sensation intact, DTR normal. Strength 5/5 in all 4.  Psychiatric: Normal judgment and insight. Alert and oriented x 3. Normal mood.   Data Reviewed:  EKG reveals sinus tachycardia 111 bpm with signs of early repolarization.  Reviewed labs, imaging, and pertinent records as documented.  Assessment and Plan:  Seizure Prior to arrival.  Patient was witnessed having 2 minutes of generalized tonic-clonic seizure activity which may have been the reason that he fell from the top bunk bed.  Initial CT imaging of the head, cervical spine, and maxillofacial were negative for any acute abnormalities.  Neurology consulted and recommended follow-up imaging with MRI with and without contrast and EEG.  As this is his second episode of seizure-like activity patient had been loaded with fosphenytoin  150 mg IV and started on Dilantin  100 mg 3 times daily. - Admitted to a telemetry bed - Seizure precautions - Follow-up UDS - Follow-up MRI of the brain with and without contrast - Follow-up EEG - Normal saline IV fluids at 75 mL/h - Dilantin  100 mg 3 times daily - Appreciate neurology consultative services, will follow-up for any further recommendations - Will need to update patient on seizure precautions and restrictions  Facial laceration secondary to fall from bed Acute.  Patient fell out of the top bunk while at jail.  Fall thought to be provoked from seizure.  Laceration was repaired by the ED provider with sutures. - Sutures need to be removed in 1 week  History of CVA Patient with prior history of a substance induced CVA back in 2022.  Patient reported having initial left-sided weakness but reported complete recovery since that time.  Mixed dyslipidemia - Continue gemfibrozil   Mood disorder - Continue Abilify   History of  substance abuse Patient with a prior history of K2 use which is reportedly as synthetic cannabinoid - Follow-up UDS  DVT prophylaxis: Lovenox  Advance Care Planning:   Code Status: Full Code   Consults: Neurology  Family Communication: None  Severity of Illness: The appropriate patient status for this patient is OBSERVATION. Observation status is judged to be reasonable and necessary in order to provide the required intensity of service to ensure the patient's safety. The patient's presenting symptoms, physical exam findings, and initial radiographic and laboratory data in the context of their medical condition is felt to place them at decreased risk for further clinical deterioration. Furthermore, it is anticipated that the patient will be medically stable for discharge from the hospital within 2 midnights of admission.   Author: Maximino DELENA Sharps, MD 09/28/2023 8:22 AM  For on call review [email or web address].

## 2023-09-28 NOTE — Consult Note (Signed)
 NEUROLOGY CONSULT NOTE   Date of service: September 28, 2023 Patient Name: Garrett West MRN:  [id number] DOB:  1995-12-27 Chief Complaint: Seizure Requesting Provider: Griselda Norris, MD  History of Present Illness  Garrett West is a 28 y.o. male with a PMHx of substance-related multifocal strokes who presents to the ED from jail after a GTC seizure there, with witnessed portion lasting for about 2 minutes. The patient is amnestic for the event. The last thing he remembers was lying in his bunk (second level) getting ready to go to sleep. Staff heard a thud and then saw patient convulsing, lasting about 2 minutes. No incontinence or tongue-bite noted. He sustained a 2-3 in left forehead laceration according to EMS, due to falling from top bunk. He was mildly postictal for EMS. The patient denies any recent drug or alcohol use.   He was recently assessed in the ED after an episode of AMS during which he became rigid with a blank stare, followed by somnolence on regaining consciousness. Per EDP note from 09/09/23: 28 year old male with history of prior CVA, presenting to the ED with altered mental status.  Patient is inmate [facility].  Patient had a normal day, has felt fine all day.  Was in his cell with cellmate this evening when all of a sudden he became very rigid with a blank stare on his face and was not responding.  There was no incontinence.  Jail staff attempted 8 mg Narcan  without response.  Upon EMS arrival he was very drowsy and difficult to arouse but has come to and route to the ED.  Initial heart rate was in the 150s but has returned to baseline here after half liter of fluid.  He denies any prior hx of seizures or other neurologic disease.  Denies and drug use or alcohol use today.  Other than the prior assessment for a staring spell, the patient does not recall any prior seizure history.     ROS  Comprehensive ROS performed and pertinent positives documented  in HPI    Past History  No past medical history on file.  Past Surgical History:  Procedure Laterality Date   I & D EXTREMITY Right 08/05/2021   Procedure: IRRIGATION AND DEBRIDEMENT RIGHT INDEX FINGER;  Surgeon: Romona Harari, MD;  Location: [facility] OR;  Service: Orthopedics;  Laterality: Right;    Family History: No family history on file.  Social History  reports that he has never smoked. He has never used smokeless tobacco. He reports that he does not currently use alcohol. He reports current drug use. Drugs: Methamphetamines and Marijuana. Note: Drug use description above was from a prior assessment. He currently denies any drug use.   No Known Allergies  Medications  No current facility-administered medications for this encounter.  Current Outpatient Medications:    ibuprofen  (ADVIL ) 600 MG tablet, Take 1 tablet (600 mg total) by mouth every 6 (six) hours as needed for headache, fever, moderate pain or mild pain. Take after meals, Disp: 30 tablet, Rfl: 0  Vitals   Vitals:   09/28/23 0315 09/28/23 0330 09/28/23 0345 09/28/23 0400  BP:  (!) 89/60 (!) 89/63   Pulse: (!) 109 (!) 101 100 94  Resp: (!) 22 (!) 21 19 16   Temp:      TempSrc:      SpO2: 94% 93% 92% 95%  Weight:      Height:        Body mass index is 35.57 kg/m.   Physical  Exam   Constitutional: Appears well-developed and well-nourished.  Psych: Affect appropriate to situation.  Eyes: No scleral injection.  HENT: No OP obstruction.  Head: Normocephalic. Left forehead laceration with stitches (from fall in jail tonight) Respiratory: Effort normal, non-labored breathing.    Neurologic Examination   Mental Status: Alert and oriented.  Speech fluent without evidence of aphasia.  Able to follow all commands without difficulty. Cranial Nerves: II: Fixates and tracks normally. PERRL  III,IV, VI: No ptosis. EOMI. No nystagmus.  V: Temp sensation equal bilaterally  VII: Smile symmetric VIII: Hearing  intact to voice IX,X: No hoarseness XI: Symmetric shoulder shrug XII: Midline tongue extension. No tongue-bite noted.  Motor: BUE 5/5 proximally and distally BLE 5/5 proximally and distally  Sensory: Light touch intact throughout, bilaterally.   Deep Tendon Reflexes: 2+ and symmetric bilateral brachioradialis and biceps. Unable to elicit patellar reflexes.  Cerebellar: No ataxia with FNF bilaterally  Gait: Unable to assess  Labs/Imaging/Neurodiagnostic studies   CBC:  Recent Labs  Lab 2023/10/26 0101  WBC 9.7  NEUTROABS 6.4  HGB 15.4  HCT 47.3  MCV 85.5  PLT 257   Basic Metabolic Panel:  Lab Results  Component Value Date   NA 137 10/26/23   K 4.2 10/26/23   CO2 20 (L) Oct 26, 2023   GLUCOSE 108 (H) 10-26-2023   BUN 6 10/26/23   CREATININE 0.94 October 26, 2023   CALCIUM 9.3 10/26/23   GFRNONAA >60 10/26/23   Lipid Panel:  Lab Results  Component Value Date   LDLCALC 76 05/29/2020   HgbA1c:  Lab Results  Component Value Date   HGBA1C 5.5 08/06/2021   Urine Drug Screen:     Component Value Date/Time   LABOPIA NONE DETECTED 09/10/2023 0018   COCAINSCRNUR NONE DETECTED 09/10/2023 0018   LABBENZ NONE DETECTED 09/10/2023 0018   AMPHETMU NONE DETECTED 09/10/2023 0018   THCU NONE DETECTED 09/10/2023 0018   LABBARB NONE DETECTED 09/10/2023 0018    Alcohol Level     Component Value Date/Time   [facility] <15 09/09/2023 2213   Prior MRI (05/29/20): 1. 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 Associated edema without significant mass effect. Given involvement of multiple vascular territories, consider embolic etiology. 2. Mild paranasal sinus mucosal thickening with bilateral sphenoid sinus air-fluid levels. 3. Diffuse T1 hypointensity of the visualized cervical bone marrow, which is nonspecific but could be related to chronic anemia, chronic hypoxia (such as in smokers), or less likely a  lymphoproliferative disorder.  ASSESSMENT  28 y.o. male with a PMHx of substance-related multifocal strokes who presents to the ED from jail after a GTC seizure there, with witnessed portion lasting for about 2 minutes. The patient is amnestic for the event. The last thing he remembers was lying in his bunk (second level) getting ready to go to sleep. Staff heard a thud and then saw patient convulsing, lasting about 2 minutes. No incontinence or tongue-bite noted. He sustained a 2-3 in left forehead laceration according to EMS, due to falling from top bunk. He was mildly postictal for EMS. The patient denies any recent drug or alcohol use. He was recently assessed in the ED after an episode of AMS during which he became rigid with a blank stare, followed by somnolence on regaining consciousness. - Exam reveals no focal deficits.  - CT head and C-spine:  No evidence of acute intracranial abnormality. No acute fracture or traumatic malalignment in the cervical spine. No acute  facial fracture. Left forehead contusion. - Labs:  - Normal Na, K, Ca and Mg - BUN and Cr normal.  - CBC normal.  - Glucose 108.  - UDS is pending - Ammonia level was elevated at 47 in 2022 - Impression: - GTC seizure at jail. Description provided is more consistent with a seizure causing a fall rather than a fall precipitating a concussive convulsion. No provoking factors on labs and no preceding symptoms described by patient. Recent prior evaluation for a staring spell. The current seizure is best classified as a second event. Initiation of anticonvulsant is indicated.  - Prior assessment in 2022 with MRI revealing acute multifocal strokes in the setting of substance abuse.    RECOMMENDATIONS  - EEG (ordered) - MRI brain with and without contrast - Ammonia level (ordered) - Inpatient seizure precautions - Outpatient seizure precautions: Per [place]  DMV statutes, patients with seizures are not allowed to drive  until  they have been seizure-free for six months. Use caution when using heavy equipment or power tools. Avoid working on ladders or at heights. Take showers instead of baths. Ensure the water temperature is not too high on the home water heater. Do not go swimming alone. When caring for infants or small children, sit down when holding, feeding, or changing them to minimize risk of injury to the child in the event you have a seizure. Also, Maintain good sleep hygiene. Avoid alcohol. - Fosphenytoin  loading dose of 20 mg PE/kg (ordered) - Starting phenytoin  100 mg po TID (ordered) ______________________________________________________________________    Bonney SHARK, Debra Colon, MD [facility]

## 2023-09-29 DIAGNOSIS — R569 Unspecified convulsions: Secondary | ICD-10-CM | POA: Diagnosis not present

## 2023-09-29 LAB — CBC
HCT: 46.8 % (ref 39.0–52.0)
Hemoglobin: 14.9 g/dL (ref 13.0–17.0)
MCH: 27.3 pg (ref 26.0–34.0)
MCHC: 31.8 g/dL (ref 30.0–36.0)
MCV: 85.7 fL (ref 80.0–100.0)
Platelets: 242 K/uL (ref 150–400)
RBC: 5.46 MIL/uL (ref 4.22–5.81)
RDW: 13.4 % (ref 11.5–15.5)
WBC: 9.1 K/uL (ref 4.0–10.5)
nRBC: 0 % (ref 0.0–0.2)

## 2023-09-29 LAB — BASIC METABOLIC PANEL WITH GFR
Anion gap: 11 (ref 5–15)
BUN: 10 mg/dL (ref 6–20)
CO2: 25 mmol/L (ref 22–32)
Calcium: 9.3 mg/dL (ref 8.9–10.3)
Chloride: 102 mmol/L (ref 98–111)
Creatinine, Ser: 1.02 mg/dL (ref 0.61–1.24)
GFR, Estimated: >60 mL/min (ref >60)
Glucose, Bld: 99 mg/dL (ref 70–99)
Potassium: 3.9 mmol/L (ref 3.5–5.1)
Sodium: 138 mmol/L (ref 135–145)

## 2023-09-29 MED ORDER — PHENYTOIN 50 MG PO CHEW: 100.0000 mg | CHEWABLE_TABLET | Freq: Three times a day (TID) | ORAL | 0 refills | Status: AC

## 2023-09-29 MED ORDER — POTASSIUM CHLORIDE CRYS ER 20 MEQ PO TBCR
20.0000 meq | EXTENDED_RELEASE_TABLET | Freq: Once | ORAL | Status: AC
  Administered 2023-09-29: 20 meq via ORAL
  Filled 2023-09-29: qty 1

## 2023-09-29 MED ORDER — ORAL CARE MOUTH RINSE: 15.0000 mL | OROMUCOSAL | Status: DC | PRN

## 2023-09-29 NOTE — Discharge Summary (Addendum)
 Physician Discharge Summary  Garrett West FMW:[id number] DOB: 06-Jun-1995 DOA: 09/28/2023  PCP: Pcp, No  Admit date: 09/28/2023 Discharge date: 09/29/2023  Time spent: 40 minutes  Recommendations for Outpatient Follow-up:  Follow outpatient CBC/CMP  Needs neurology follow up outpatient  Seizure precautions  Sutures should be evaluated to be removed in about 1 week (~9/24)   Discharge Diagnoses:  Principal Problem:   Seizure Garrett West) Active Problems:   Facial laceration   Fall from bed   History of CVA (cerebrovascular accident)   Dyslipidemia   Mood disorder (HCC)   History of substance abuse (HCC)   Discharge Condition: stable  Diet recommendation: heart healthy  Filed Weights   09/28/23 0053  Weight: 94 kg    History of present illness:   Garrett West is Garrett West 28 y.o. male with medical history significant of substance related multifocal stroke in 2022 presented after having Garrett West suspected seizure.   He was found on the ground after likely falling from the second level bunk bed.  It was reported that he was witnessed having 2 to 3 minutes of generalized tonic clonic seizure activity while on the ground.  He does not recall the event and only remembers waking up on the ground.  Review of records note patient had Garrett West similar episode on 8/29 for which he was brought to the hospital, but workup including CT head and lab work were negative and no antiseizure medication was recommended at that time.   Approximately two years ago, he experienced Garrett West stroke that initially resulted in left arm weakness, which has since resolved. He denies any current residual weakness.   He's now been seen by neurology.  Plan is for him to discharge on phenytoin  given recurrent seizure (2nd event).  He'll need outpatient neurology follow up.   Hospital Course:  Assessment and Plan:  Seizure Prior to arrival.  Patient was witnessed having 2 minutes of generalized tonic-clonic seizure activity  which may have been the reason that he fell from the top bunk bed.  Initial CT imaging of the head, cervical spine, and maxillofacial were negative for any acute abnormalities other than L forehead contusion. MRI brain with/without without acute intracranial abnormalities (chronic infarcts noted) EEG without seizures or epileptiform discharges Appreciate neurology recommendations - recommending continue phenytoin  and outpatient neurology follow up  Per [place]  [place] statutes, patients with seizures are not allowed to drive until  they have been seizure-free for six months. Use caution when using heavy equipment or power tools. Avoid working on ladders or at heights. Take showers instead of baths. Ensure the water temperature is not too high on the home water heater. Do not go swimming alone. When caring for infants or small children, sit down when holding, feeding, or changing them to minimize risk of injury to the child in the event you have Garrett West seizure. Also, Maintain good sleep hygiene. Avoid alcohol.   Facial laceration secondary to fall from bed Acute.  Patient fell out of the [facility] at jail.  Fall thought to be provoked from seizure.  Laceration was repaired by the ED provider with sutures (prolene, 6 simple interrupted - 9/17). - Sutures need to be removed in 1 week (~9/24)   History of CVA Patient with prior history of Garrett West substance induced CVA back in 2022.  Patient reported having initial left-sided weakness but reported complete recovery since that time.   Mixed dyslipidemia - Continue gemfibrozil    Mood disorder - Continue Abilify    History of substance  abuse Patient with Garrett West prior history of Garrett West use which is reportedly as synthetic cannabinoid - Follow-up UDS - negative     Procedures: EEG  IMPRESSION: This study is within normal limits. No seizures or epileptiform discharges were seen throughout the recording.   Garrett West normal interictal EEG does not exclude the  diagnosis of epilepsy.  Consultations: neurology  Discharge Exam: Vitals:   09/29/23 0337 09/29/23 0722  BP: 128/85 110/83  Pulse: 92 93  Resp: 18   Temp: 98 F (36.7 C) 98.3 F (36.8 C)  SpO2: 94% 93%   No complaints Denies pain or discomfort Declined spanish interpreter  General: No acute distress. Laceration to L forehead repaired with sutures  Cardiovascular: RRR Lungs: unlabored Neurological: Alert and oriented 3. Moves all extremities 4 with equal strength. Cranial nerves II through XII grossly intact. Extremities: No clubbing or cyanosis. No edema. Discharge Instructions   Discharge Instructions     Ambulatory referral to Neurology   Complete by: As directed    An appointment is requested in approximately: 8 weeks   Call MD for:  difficulty breathing, headache or visual disturbances   Complete by: As directed    Call MD for:  extreme fatigue   Complete by: As directed    Call MD for:  hives   Complete by: As directed    Call MD for:  persistant dizziness or light-headedness   Complete by: As directed    Call MD for:  persistant nausea and vomiting   Complete by: As directed    Call MD for:  redness, tenderness, or signs of infection (pain, swelling, redness, odor or green/yellow discharge around incision site)   Complete by: As directed    Call MD for:  severe uncontrolled pain   Complete by: As directed    Call MD for:  temperature >100.4   Complete by: As directed    Diet - low sodium heart healthy   Complete by: As directed    Discharge instructions   Complete by: As directed    You were seen after Garrett West suspected seizure.  Since this is your second suspected seizure, we've started you on Garrett West seizure medicine called dilantin  (or phenytoin ).  You should continue this and follow up with neurology as an outpatient in 8-12 weeks.  I'd recommend sleeping on the bottom bunk if possible.  Per [place]  [place] statutes, patients with seizures are not allowed  to drive until  they have been seizure-free for six months. Use caution when using heavy equipment or power tools. Avoid working on ladders or at heights. Take showers instead of baths. Ensure the water temperature is not too high on the home water heater. Do not go swimming alone. When caring for infants or small children, sit down when holding, feeding, or changing them to minimize risk of injury to the child in the event you have Rayshawn Maney seizure. Also, Maintain good sleep hygiene. Avoid alcohol.  Your stitches should come out in about 1 week.  Return for new, recurrent, or worsening symptoms.  Please ask your PCP to request records from this hospitalization so they know what was done and what the next steps will be.   Discharge wound care:   Complete by: As directed    Sutures can be removed on 9/24.   Increase activity slowly   Complete by: As directed       Allergies as of 09/29/2023   No Known Allergies      Medication List  TAKE these medications    ARIPiprazole  10 MG tablet Commonly known as: ABILIFY  Take 10 mg by mouth at bedtime. For 120 days   gemfibrozil  600 MG tablet Commonly known as: LOPID  Take 600 mg by mouth 2 (two) times daily before Garrett West meal. For 90 days   phenytoin  50 MG tablet Commonly known as: DILANTIN  Chew 2 tablets (100 mg total) by mouth 3 (three) times daily.               Discharge Care Instructions  (From admission, onward)           Start     Ordered   09/29/23 0000  Discharge wound care:       Comments: Sutures can be removed on 9/24.   09/29/23 0933           No Known Allergies    The results of significant diagnostics from this hospitalization (including imaging, microbiology, ancillary and laboratory) are listed below for reference.    Significant Diagnostic Studies: EEG adult Result Date: 09/28/2023 West Garrett KIDD, MD     09/28/2023  2:45 PM Patient Name: Garrett West MRN: [id number] Epilepsy Attending:  Arlin KIDD West Referring Physician/Provider: Merrianne Locus, MD Date: 09/28/2023 Duration: 22.09 mins Patient history: 28 y.o. male with Carole Doner PMHx of substance-related multifocal strokes who [facility] jail after Garrett [place] GTC seizure there, with witnessed portion lasting for about 2 minutes.  EEG to evaluate for seizure Level of alertness: Awake AEDs during EEG study: PHT Technical aspects: This EEG study was done with scalp electrodes positioned according to the 10-20 International system of electrode placement. Electrical activity was reviewed with band pass filter of 1-70Hz , sensitivity of 7 uV/mm, display speed of 77mm/sec with Chardae Mulkern 60Hz  notched filter applied as appropriate. EEG data were recorded continuously and digitally stored.  Video monitoring was available and reviewed as appropriate. Description: The posterior dominant rhythm consists of 8 Hz activity of moderate voltage (25-35 uV) seen predominantly in posterior head regions, symmetric and reactive to eye opening and eye closing. Hyperventilation and photic stimulation were not performed.   IMPRESSION: This study is within normal limits. No seizures or epileptiform discharges were seen throughout the recording. Keaisha Sublette normal interictal EEG does not exclude the diagnosis of epilepsy. Garrett West   MR Brain W and Wo Contrast Result Date: 09/28/2023 CLINICAL DATA:  Seizure, new-onset, no history of trauma. EXAM: MRI HEAD WITHOUT AND WITH CONTRAST TECHNIQUE: Multiplanar, multiecho pulse sequences of the brain and surrounding structures were obtained without and with intravenous contrast. CONTRAST:  9mL GADAVIST  GADOBUTROL  1 MMOL/ML IV SOLN COMPARISON:  Head CT 09/28/2023 and MRI 05/29/2020 FINDINGS: Brain: There is no evidence of an acute infarct, mass, midline shift, or extra-axial fluid collection. Cerebral volume is normal. The ventricles are normal in size. Nidal Rivet normal variant cavum septum pellucidum et vergae is noted. There are chronic lacunar  infarcts in the basal ganglia bilaterally with associated chronic blood products on the right. Janaya Broy chronic left parietal white matter infarct is also noted. These were all acute in 2022. No abnormal enhancement is identified. The hippocampi are symmetric and size and signal aside from Glynis Hunsucker tiny cyst along the medial aspect of the right hippocampus. Vascular: Major intracranial vascular flow voids are preserved. Skull and upper cervical spine:  Unremarkable bone marrow signal. Sinuses/Orbits: Unremarkable orbits. Mild left ethmoid air cell mucosal thickening. Clear mastoid air cells. Other: Left frontal scalp hematoma. IMPRESSION: 1. No acute intracranial abnormality. 2. Chronic infarcts as  above. Electronically Signed   By: Garrett West M.D.   On: 09/28/2023 10:19   CT Maxillofacial WO CM Result Date: 09/28/2023 CLINICAL DATA:  Facial trauma, blunt; Head trauma, abnormal mental status (Age 82-64y) seizure; Neck trauma, dangerous injury mechanism (Age 41-64y) EXAM: CT HEAD WITHOUT CONTRAST CT MAXILLOFACIAL WITHOUT CONTRAST CT CERVICAL SPINE WITHOUT CONTRAST TECHNIQUE: Multidetector CT imaging of the head, cervical spine, and maxillofacial structures were performed using the standard protocol without intravenous contrast. Multiplanar CT image reconstructions of the cervical spine and maxillofacial structures were also generated. RADIATION DOSE REDUCTION: This exam was performed according to the departmental dose-optimization program which includes automated exposure control, adjustment of the mA and/or kV according to patient size and/or use of iterative reconstruction technique. COMPARISON:  None Available. FINDINGS: CT HEAD FINDINGS Brain: No evidence of acute infarction, hemorrhage, hydrocephalus, extra-axial collection or mass lesion/mass effect. Remote right basal ganglia lacunar infarct. Vascular: No hyperdense vessel or unexpected calcification. Skull: No acute fracture.  Left forehead contusion. Other: No  mastoid effusions. CT MAXILLOFACIAL FINDINGS Osseous: No fracture or mandibular dislocation. No destructive process. Orbits: Negative. No traumatic or inflammatory finding. Sinuses: Mostly clear.  No air-fluid levels. Soft tissues: Left forehead contusion. CT CERVICAL SPINE FINDINGS Alignment: Normal. Skull base and vertebrae: No acute fracture. No primary bone lesion or focal pathologic process. Soft tissues and spinal canal: No prevertebral fluid or swelling. No visible canal hematoma. Disc levels:  No significant bony degenerative change. Upper chest: Lung apices are clear. IMPRESSION: 1. No evidence of acute intracranial abnormality. 2. No acute fracture or traumatic malalignment in the cervical spine. 3. No acute facial fracture. Left forehead contusion. Electronically Signed   By: Garrett West M.D.   On: 09/28/2023 03:02   CT Head Wo Contrast Result Date: 09/28/2023 CLINICAL DATA:  Facial trauma, blunt; Head trauma, abnormal mental status (Age 15-64y) seizure; Neck trauma, dangerous injury mechanism (Age 96-64y) EXAM: CT HEAD WITHOUT CONTRAST CT MAXILLOFACIAL WITHOUT CONTRAST CT CERVICAL SPINE WITHOUT CONTRAST TECHNIQUE: Multidetector CT imaging of the head, cervical spine, and maxillofacial structures were performed using the standard protocol without intravenous contrast. Multiplanar CT image reconstructions of the cervical spine and maxillofacial structures were also generated. RADIATION DOSE REDUCTION: This exam was performed according to the departmental dose-optimization program which includes automated exposure control, adjustment of the mA and/or kV according to patient size and/or use of iterative reconstruction technique. COMPARISON:  None Available. FINDINGS: CT HEAD FINDINGS Brain: No evidence of acute infarction, hemorrhage, hydrocephalus, extra-axial collection or mass lesion/mass effect. Remote right basal ganglia lacunar infarct. Vascular: No hyperdense vessel or unexpected  calcification. Skull: No acute fracture.  Left forehead contusion. Other: No mastoid effusions. CT MAXILLOFACIAL FINDINGS Osseous: No fracture or mandibular dislocation. No destructive process. Orbits: Negative. No traumatic or inflammatory finding. Sinuses: Mostly clear.  No air-fluid levels. Soft tissues: Left forehead contusion. CT CERVICAL SPINE FINDINGS Alignment: Normal. Skull base and vertebrae: No acute fracture. No primary bone lesion or focal pathologic process. Soft tissues and spinal canal: No prevertebral fluid or swelling. No visible canal hematoma. Disc levels:  No significant bony degenerative change. Upper chest: Lung apices are clear. IMPRESSION: 1. No evidence of acute intracranial abnormality. 2. No acute fracture or traumatic malalignment in the cervical spine. 3. No acute facial fracture. Left forehead contusion. Electronically Signed   By: Garrett West M.D.   On: 09/28/2023 03:02   CT Cervical Spine Wo Contrast Result Date: 09/28/2023 CLINICAL DATA:  Facial trauma, blunt; Head trauma, abnormal mental status (  Age 53-64y) seizure; Neck trauma, dangerous injury mechanism (Age 59-64y) EXAM: CT HEAD WITHOUT CONTRAST CT MAXILLOFACIAL WITHOUT CONTRAST CT CERVICAL SPINE WITHOUT CONTRAST TECHNIQUE: Multidetector CT imaging of the head, cervical spine, and maxillofacial structures were performed using the standard protocol without intravenous contrast. Multiplanar CT image reconstructions of the cervical spine and maxillofacial structures were also generated. RADIATION DOSE REDUCTION: This exam was performed according to the departmental dose-optimization program which includes automated exposure control, adjustment of the mA and/or kV according to patient size and/or use of iterative reconstruction technique. COMPARISON:  None Available. FINDINGS: CT HEAD FINDINGS Brain: No evidence of acute infarction, hemorrhage, hydrocephalus, extra-axial collection or mass lesion/mass effect. Remote right  basal ganglia lacunar infarct. Vascular: No hyperdense vessel or unexpected calcification. Skull: No acute fracture.  Left forehead contusion. Other: No mastoid effusions. CT MAXILLOFACIAL FINDINGS Osseous: No fracture or mandibular dislocation. No destructive process. Orbits: Negative. No traumatic or inflammatory finding. Sinuses: Mostly clear.  No air-fluid levels. Soft tissues: Left forehead contusion. CT CERVICAL SPINE FINDINGS Alignment: Normal. Skull base and vertebrae: No acute fracture. No primary bone lesion or focal pathologic process. Soft tissues and spinal canal: No prevertebral fluid or swelling. No visible canal hematoma. Disc levels:  No significant bony degenerative change. Upper chest: Lung apices are clear. IMPRESSION: 1. No evidence of acute intracranial abnormality. 2. No acute fracture or traumatic malalignment in the cervical spine. 3. No acute facial fracture. Left forehead contusion. Electronically Signed   By: Garrett West M.D.   On: 09/28/2023 03:02   CT Head Wo Contrast Result Date: 09/09/2023 CLINICAL DATA:  Seizure, new-onset, history of trauma EXAM: CT HEAD WITHOUT CONTRAST TECHNIQUE: Contiguous axial images were obtained from the base of the skull through the vertex without intravenous contrast. RADIATION DOSE REDUCTION: This exam was performed according to the departmental dose-optimization program which includes automated exposure control, adjustment of the mA and/or kV according to patient size and/or use of iterative reconstruction technique. COMPARISON:  Head CT 07/30/2022 FINDINGS: Brain: No evidence of acute infarction, hemorrhage, hydrocephalus, extra-axial collection or mass lesion/mass effect. Stable lacunar infarcts in the basal ganglia. Incidental cavum septum pellucidum. Vascular: No hyperdense vessel or unexpected calcification. Skull: No fracture or focal lesion. Sinuses/Orbits: Paranasal sinuses and mastoid air cells are clear. The visualized orbits are  unremarkable. Other: Unremarkable scalp soft tissues. IMPRESSION: 1. No acute intracranial abnormality. 2. Unchanged remote lacunar infarcts in the basal ganglia. Electronically Signed   By: Andrea Gasman M.D.   On: 09/09/2023 22:57   DG Chest Port 1 View Result Date: 09/09/2023 CLINICAL DATA:  AMS EXAM: PORTABLE CHEST 1 VIEW COMPARISON:  Chest radiograph 07/30/2022 FINDINGS: The cardiomediastinal contours are normal. The lungs are clear. Pulmonary vasculature is normal. No consolidation, pleural effusion, or pneumothorax. No acute osseous abnormalities are seen. IMPRESSION: No active disease. Electronically Signed   By: Andrea Gasman M.D.   On: 09/09/2023 22:52    Microbiology: No results found for this or any previous visit (from the past 240 hours).   Labs: Basic Metabolic Panel: Recent Labs  Lab 09/28/23 0101 09/29/23 0123  NA 137 138  K 4.2 3.9  CL 104 102  CO2 20* 25  GLUCOSE 108* 99  BUN 6 10  CREATININE 0.94 1.02  CALCIUM 9.3 9.3  MG 2.0  --    Liver Function Tests: Recent Labs  Lab 09/28/23 0101  AST 38  ALT 39  ALKPHOS 90  BILITOT 0.6  PROT 6.3*  ALBUMIN 4.0   No results  for input(s): LIPASE, AMYLASE in the last 168 hours. Recent Labs  Lab 09/28/23 0657  AMMONIA 42*   CBC: Recent Labs  Lab 09/28/23 0101 09/29/23 0123  WBC 9.7 9.1  NEUTROABS 6.4  --   HGB 15.4 14.9  HCT 47.3 46.8  MCV 85.5 85.7  PLT 257 242   Cardiac Enzymes: No results for input(s): CKTOTAL, CKMB, CKMBINDEX, TROPONINI in the last 168 hours. BNP: BNP (last 3 results) No results for input(s): BNP in the last 8760 hours.  ProBNP (last 3 results) No results for input(s): PROBNP in the last 8760 hours.  CBG: No results for input(s): GLUCAP in the last 168 hours.     Signed:  Meliton Monte MD.  [facility] 09/29/2023, 9:33 AM

## 2023-09-29 NOTE — TOC Transition Note (Signed)
 Transition of Care [facility]) - Discharge Note   Patient Details  Name: Miro Balderson MRN: [id number] Date of Birth: 29-Jan-1995  [facility] CM/SW Contact:  Andrez JULIANNA George, RN Phone Number: 09/29/2023, 9:46 AM   Clinical Narrative:     [facility] will arrange transportation.  No further needs per IP Care management.   Final next level of care: [facility] Barriers to Discharge: No Barriers Identified   Patient Goals and CMS Choice            Discharge Placement                       Discharge Plan and Services Additional resources added to the After Visit Summary for                                       Social Drivers of Health (SDOH) Interventions SDOH Screenings   Food Insecurity: No Food Insecurity (09/28/2023)  Housing: Low Risk  (09/28/2023)  Transportation Needs: No Transportation Needs (09/28/2023)  Utilities: Not At Risk (09/28/2023)  Tobacco Use: Low Risk  (09/28/2023)     Readmission Risk Interventions     No data to display

## 2023-09-29 NOTE — Plan of Care (Signed)

## 2023-09-29 NOTE — Progress Notes (Signed)
 Chart review note  MRI of the brain unremarkable for acute process.  Shows chronic lacunar infarctions as had been noted on prior studies. EEG has been unremarkable Continue phenytoin  Follow-up with outpatient neurology in 8 to 12 weeks Plan was relayed to Dr. Perri  -- Eligio Lav, MD Neurologist [facility]

## 2023-12-13 ENCOUNTER — Ambulatory Visit: Admitting: Neurology

## 2023-12-13 ENCOUNTER — Encounter: Payer: Self-pay | Admitting: Neurology
